# Patient Record
Sex: Male | Born: 1955 | Race: Black or African American | Hispanic: No | Marital: Married
Health system: Southern US, Community
[De-identification: ages and names within clinical notes are randomized; demographics above are authoritative.]

## PROBLEM LIST (undated history)

## (undated) DIAGNOSIS — U071 COVID-19: Secondary | ICD-10-CM

## (undated) DIAGNOSIS — J9621 Acute and chronic respiratory failure with hypoxia: Secondary | ICD-10-CM

## (undated) DIAGNOSIS — I4891 Unspecified atrial fibrillation: Secondary | ICD-10-CM

## (undated) DIAGNOSIS — J189 Pneumonia, unspecified organism: Secondary | ICD-10-CM

## (undated) DIAGNOSIS — J449 Chronic obstructive pulmonary disease, unspecified: Secondary | ICD-10-CM

---

## 1998-07-24 ENCOUNTER — Encounter: Admission: RE | Admit: 1998-07-24 | Discharge: 1998-10-22 | Payer: Self-pay | Admitting: Anesthesiology

## 1998-10-12 ENCOUNTER — Encounter: Payer: Self-pay | Admitting: Orthopedic Surgery

## 1998-10-17 ENCOUNTER — Observation Stay (HOSPITAL_COMMUNITY): Admission: RE | Admit: 1998-10-17 | Discharge: 1998-10-18 | Payer: Self-pay | Admitting: Orthopedic Surgery

## 1998-10-17 ENCOUNTER — Encounter: Payer: Self-pay | Admitting: Orthopedic Surgery

## 2020-06-14 ENCOUNTER — Inpatient Hospital Stay
Admission: EM | Admit: 2020-06-14 | Discharge: 2020-07-14 | Disposition: A | Discharge status: Skilled Nursing Facility | Payer: 59 | Source: Other Acute Inpatient Hospital | Attending: Internal Medicine | Admitting: Internal Medicine

## 2020-06-14 ENCOUNTER — Other Ambulatory Visit (HOSPITAL_COMMUNITY): Payer: 59

## 2020-06-14 DIAGNOSIS — J449 Chronic obstructive pulmonary disease, unspecified: Secondary | ICD-10-CM | POA: Diagnosis present

## 2020-06-14 DIAGNOSIS — I4891 Unspecified atrial fibrillation: Secondary | ICD-10-CM | POA: Diagnosis present

## 2020-06-14 DIAGNOSIS — J9621 Acute and chronic respiratory failure with hypoxia: Secondary | ICD-10-CM | POA: Diagnosis present

## 2020-06-14 DIAGNOSIS — J9690 Respiratory failure, unspecified, unspecified whether with hypoxia or hypercapnia: Secondary | ICD-10-CM

## 2020-06-14 DIAGNOSIS — J189 Pneumonia, unspecified organism: Secondary | ICD-10-CM | POA: Diagnosis present

## 2020-06-14 DIAGNOSIS — U071 COVID-19: Secondary | ICD-10-CM | POA: Diagnosis present

## 2020-06-14 DIAGNOSIS — Z931 Gastrostomy status: Secondary | ICD-10-CM

## 2020-06-14 HISTORY — DX: COVID-19: U07.1

## 2020-06-14 HISTORY — DX: Chronic obstructive pulmonary disease, unspecified: J44.9

## 2020-06-14 HISTORY — DX: Acute and chronic respiratory failure with hypoxia: J96.21

## 2020-06-14 HISTORY — DX: Pneumonia, unspecified organism: J18.9

## 2020-06-14 HISTORY — DX: Unspecified atrial fibrillation: I48.91

## 2020-06-14 LAB — BLOOD GAS, ARTERIAL
Acid-Base Excess: 9.4 mmol/L — ABNORMAL HIGH (ref 0.0–2.0)
Bicarbonate: 33.7 mmol/L — ABNORMAL HIGH (ref 20.0–28.0)
FIO2: 40
O2 Saturation: 90.7 %
Patient temperature: 37
pCO2 arterial: 49.4 mmHg — ABNORMAL HIGH (ref 32.0–48.0)
pH, Arterial: 7.449 (ref 7.350–7.450)
pO2, Arterial: 56.5 mmHg — ABNORMAL LOW (ref 83.0–108.0)

## 2020-06-14 MED ORDER — IOHEXOL 300 MG/ML  SOLN: 50.0000 mL | Freq: Once | INTRAMUSCULAR | Status: DC | PRN

## 2020-06-15 ENCOUNTER — Institutional Professional Consult (permissible substitution) (HOSPITAL_COMMUNITY): Payer: 59

## 2020-06-15 LAB — COMPREHENSIVE METABOLIC PANEL
ALT: 27 U/L (ref 0–44)
AST: 25 U/L (ref 15–41)
Albumin: 2.3 g/dL — ABNORMAL LOW (ref 3.5–5.0)
Alkaline Phosphatase: 86 U/L (ref 38–126)
Anion gap: 11 (ref 5–15)
BUN: 8 mg/dL (ref 8–23)
CO2: 30 mmol/L (ref 22–32)
Calcium: 9.1 mg/dL (ref 8.9–10.3)
Chloride: 95 mmol/L — ABNORMAL LOW (ref 98–111)
Creatinine, Ser: 0.51 mg/dL — ABNORMAL LOW (ref 0.61–1.24)
GFR calc Af Amer: >60 mL/min (ref >60)
GFR calc non Af Amer: >60 mL/min (ref >60)
Glucose, Bld: 125 mg/dL — ABNORMAL HIGH (ref 70–99)
Potassium: 4.1 mmol/L (ref 3.5–5.1)
Sodium: 136 mmol/L (ref 135–145)
Total Bilirubin: 0.6 mg/dL (ref 0.3–1.2)
Total Protein: 7.9 g/dL (ref 6.5–8.1)

## 2020-06-15 LAB — CBC
HCT: 30.5 % — ABNORMAL LOW (ref 39.0–52.0)
Hemoglobin: 9 g/dL — ABNORMAL LOW (ref 13.0–17.0)
MCH: 25.5 pg — ABNORMAL LOW (ref 26.0–34.0)
MCHC: 29.5 g/dL — ABNORMAL LOW (ref 30.0–36.0)
MCV: 86.4 fL (ref 80.0–100.0)
Platelets: 455 10*3/uL — ABNORMAL HIGH (ref 150–400)
RBC: 3.53 MIL/uL — ABNORMAL LOW (ref 4.22–5.81)
RDW: 19 % — ABNORMAL HIGH (ref 11.5–15.5)
WBC: 12.9 10*3/uL — ABNORMAL HIGH (ref 4.0–10.5)
nRBC: 0 % (ref 0.0–0.2)

## 2020-06-15 LAB — TSH: TSH: 1.327 u[IU]/mL (ref 0.350–4.500)

## 2020-06-15 LAB — DIGOXIN LEVEL: Digoxin Level: 0.4 ng/mL — ABNORMAL LOW (ref 0.8–2.0)

## 2020-06-17 ENCOUNTER — Encounter: Payer: Self-pay | Admitting: Internal Medicine

## 2020-06-17 DIAGNOSIS — J9621 Acute and chronic respiratory failure with hypoxia: Secondary | ICD-10-CM

## 2020-06-17 DIAGNOSIS — J189 Pneumonia, unspecified organism: Secondary | ICD-10-CM | POA: Diagnosis not present

## 2020-06-17 DIAGNOSIS — J449 Chronic obstructive pulmonary disease, unspecified: Secondary | ICD-10-CM

## 2020-06-17 DIAGNOSIS — U071 COVID-19: Secondary | ICD-10-CM

## 2020-06-17 DIAGNOSIS — I4891 Unspecified atrial fibrillation: Secondary | ICD-10-CM

## 2020-06-17 NOTE — Consult Note (Signed)
Pulmonary Critical Care Medicine [facility] [place]  PULMONARY SERVICE  Date of Service: 06/17/2020  PULMONARY CRITICAL CARE Vernon Schultz  DGL:[id number]  DOB: Oct 13, 1956   DOA: 06/14/2020  Referring Physician: Carron Curie, MD  HPI: Vernon Schultz is a 64 y.o. male seen for follow up of Acute on Chronic Respiratory Failure.  Patient has multiple medical problems including COPD PAD hypertension atrial fibrillation came into the hospital because of rapid atrial fibrillation.  Patient was plan for cardioversion however patient was found to be positive for COVID-19.  Subsequently patient converted to sinus rhythm and was discharged.  Came back to the hospital with increasing shortness of breath and there was some palpitations noted.  Some cough was noted saturations were down into the 70% range.  Patient at that time was evaluated and in obvious acute respiratory distress and ended up on BiPAP which the patient failed and then ended up intubated on the ventilator.  Subsequently was not able to come off of the ventilator so was transferred to our facility for further management and weaning.  Currently on assist control has been tolerating T collar weaning with a goal of about 8 hours today.  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  And of course unable to come off of the ventilator.  Review of Systems:  ROS performed and is unremarkable other than noted above.  Past medical history: Hypertension PAD COPD Atrial fibrillation Respiratory failure  Past surgical history: Tracheostomy  Social history: No drug abuse unknown about tobacco alcohol use  Family history: Noncontributory to the present illness  Medications: Reviewed on Rounds  Physical Exam:  Vitals: Temperature is 98.0 pulse 79 respiratory rate 30 blood pressure is 154/94 saturations 96%  Ventilator Settings mode ventilation assist  control FiO2 is 45% tidal volume is 440 PEEP 5  . General: Comfortable at this time . Eyes: Grossly normal lids, irises & conjunctiva . ENT: grossly tongue is normal . Neck: no obvious mass . Cardiovascular: S1-S2 normal no gallop or rub . Respiratory: No rhonchi coarse breath sounds . Abdomen: Soft and nontender . Skin: no rash seen on limited exam . Musculoskeletal: not rigid . Psychiatric:unable to assess . Neurologic: no seizure no involuntary movements         Labs on Admission:  Basic Metabolic Panel: Recent Labs  Lab 06/15/20 1413  NA 136  K 4.1  CL 95*  CO2 30  GLUCOSE 125*  BUN 8  CREATININE 0.51*  CALCIUM 9.1    Recent Labs  Lab 06/14/20 2125  PHART 7.449  PCO2ART 49.4*  PO2ART 56.5*  HCO3 33.7*  O2SAT 90.7    Liver Function Tests: Recent Labs  Lab 06/15/20 1413  AST 25  ALT 27  ALKPHOS 86  BILITOT 0.6  PROT 7.9  ALBUMIN 2.3*   No results for input(s): LIPASE, AMYLASE in the last 168 hours. No results for input(s): AMMONIA in the last 168 hours.  CBC: Recent Labs  Lab 06/15/20 1413  WBC 12.9*  HGB 9.0*  HCT 30.5*  MCV 86.4  PLT 455*    Cardiac Enzymes: No results for input(s): CKTOTAL, CKMB, CKMBINDEX, TROPONINI in the last 168 hours.  BNP (last 3 results) No results for input(s): BNP in the last 8760 hours.  ProBNP (last 3 results) No results for input(s): PROBNP in the last 8760 hours.   Radiological Exams on Admission: DG ABDOMEN PEG TUBE LOCATION  Result Date: 06/14/2020 CLINICAL DATA:  Peg tube placement EXAM: ABDOMEN - 1 VIEW COMPARISON:  None. FINDINGS: Gastrostomy tube projects over the stomach. Contrast material seen within the stomach and proximal small bowel. No evidence of contrast extravasation. Nonobstructive bowel gas pattern. IMPRESSION: Gastrostomy tube within the stomach.  No contrast extravasation. Electronically Signed   By: Rolm Baptise M.D.   On: 06/14/2020 21:42   DG Chest Port 1 View  Result Date:  06/15/2020 CLINICAL DATA:  Respiratory failure EXAM: PORTABLE CHEST 1 VIEW COMPARISON:  None. FINDINGS: Tracheostomy tube is present. Heart size is within normal limits. Patchy airspace opacities throughout the right lung and within the left lung base. No appreciable pleural fluid collection. No pneumothorax. IMPRESSION: Patchy airspace opacities throughout the right lung and within the left lung base. Findings are suspicious for multifocal pneumonia. Electronically Signed   By: Davina Poke D.O.   On: 06/15/2020 15:05    Assessment/Plan Active Problems:   Acute on chronic respiratory failure with hypoxia (HCC)   Rate controlled atrial fibrillation (HCC)   COPD, severe ([place])   COVID-19 virus infection   Healthcare-associated pneumonia   1. Acute on chronic respiratory failure with hypoxia patient currently is on full support on assist control mode.  Currently is on 45% FiO2 as mentioned above.  The goal today is for weaning on T collar for 8 hours.  We will continue to advance as tolerated. 2. Atrial fibrillation patient had converted to normal sinus rhythm has been on digoxin.  Be continued on current maintenance medications. 3. Severe COPD we will continue with supportive care nebulizers as needed we will continue to monitor 4. COVID-19 virus infection in recovery phase we will continue with supportive care 5. Healthcare associated pneumonia patient has received antibiotics for presumed healthcare pneumonia.  The patient has residual changes noted on the chest films  I have personally seen and evaluated the patient, evaluated laboratory and imaging results, formulated the assessment and plan and placed orders. The Patient requires high complexity decision making with multiple systems involvement.  Case was discussed on Rounds with the Respiratory Therapy Director and the Respiratory staff Time Spent 59minutes  Deundre Thong A Gerrianne Aydelott, MD [facility] Pulmonary Critical Care Medicine Sleep Medicine

## 2020-06-18 DIAGNOSIS — J9621 Acute and chronic respiratory failure with hypoxia: Secondary | ICD-10-CM | POA: Diagnosis not present

## 2020-06-18 DIAGNOSIS — U071 COVID-19: Secondary | ICD-10-CM | POA: Diagnosis not present

## 2020-06-18 DIAGNOSIS — J189 Pneumonia, unspecified organism: Secondary | ICD-10-CM | POA: Diagnosis not present

## 2020-06-18 DIAGNOSIS — J449 Chronic obstructive pulmonary disease, unspecified: Secondary | ICD-10-CM | POA: Diagnosis not present

## 2020-06-18 NOTE — Progress Notes (Signed)
Pulmonary Critical Care Medicine [facility] [place]   PULMONARY CRITICAL CARE SERVICE  PROGRESS NOTE  Date of Service: 06/18/2020  Vernon Schultz  UJW:[id number]  DOB: 20-Jan-1956   DOA: 06/14/2020  Referring Physician: Carron Curie, MD  HPI: Vernon Schultz is a 64 y.o. male seen for follow up of Acute on Chronic Respiratory Failure.  Patient is on T collar currently is on 50% FiO2 for goal of 12 hours today  Medications: Reviewed on Rounds  Physical Exam:  Vitals: Temperature is 97.7 pulse 69 respiratory 24 blood pressure is 162/86 saturations 98%  Ventilator Settings on T collar FiO2 50%  . General: Comfortable at this time . Eyes: Grossly normal lids, irises & conjunctiva . ENT: grossly tongue is normal . Neck: no obvious mass . Cardiovascular: S1 S2 normal no gallop . Respiratory: No rhonchi no rales are noted at this time . Abdomen: soft . Skin: no rash seen on limited exam . Musculoskeletal: not rigid . Psychiatric:unable to assess . Neurologic: no seizure no involuntary movements         Lab Data:   Basic Metabolic Panel: Recent Labs  Lab 06/15/20 1413  NA 136  K 4.1  CL 95*  CO2 30  GLUCOSE 125*  BUN 8  CREATININE 0.51*  CALCIUM 9.1    ABG: Recent Labs  Lab 06/14/20 2125  PHART 7.449  PCO2ART 49.4*  PO2ART 56.5*  HCO3 33.7*  O2SAT 90.7    Liver Function Tests: Recent Labs  Lab 06/15/20 1413  AST 25  ALT 27  ALKPHOS 86  BILITOT 0.6  PROT 7.9  ALBUMIN 2.3*   No results for input(s): LIPASE, AMYLASE in the last 168 hours. No results for input(s): AMMONIA in the last 168 hours.  CBC: Recent Labs  Lab 06/15/20 1413  WBC 12.9*  HGB 9.0*  HCT 30.5*  MCV 86.4  PLT 455*    Cardiac Enzymes: No results for input(s): CKTOTAL, CKMB, CKMBINDEX, TROPONINI in the last 168 hours.  BNP (last 3 results) No results for input(s): BNP in the last 8760 hours.  ProBNP (last 3 results) No results for input(s): PROBNP in the last 8760  hours.  Radiological Exams: No results found.  Assessment/Plan Active Problems:   Acute on chronic respiratory failure with hypoxia (HCC)   Rate controlled atrial fibrillation (HCC)   COPD, severe (HCC)   COVID-19 virus infection   Healthcare-associated pneumonia   1. Acute on chronic respiratory failure hypoxia continue T collar weaning as tolerated.  Goal today is 12 hours 2. Chronic atrial fibrillation rate controlled continue medical management 3. Severe COPD at baseline we will continue to follow 4. COVID-19 virus infection resolved 5. Healthcare associated pneumonia treated clinically is improved   I have personally seen and evaluated the patient, evaluated laboratory and imaging results, formulated the assessment and plan and placed orders. The Patient requires high complexity decision making with multiple systems involvement.  Rounds were done with the Respiratory Therapy Director and Staff therapists and discussed with nursing staff also.  Yevonne Pax, MD [facility] Pulmonary Critical Care Medicine Sleep Medicine

## 2020-06-19 ENCOUNTER — Other Ambulatory Visit (HOSPITAL_COMMUNITY): Payer: 59

## 2020-06-19 DIAGNOSIS — J9621 Acute and chronic respiratory failure with hypoxia: Secondary | ICD-10-CM | POA: Diagnosis not present

## 2020-06-19 DIAGNOSIS — U071 COVID-19: Secondary | ICD-10-CM | POA: Diagnosis not present

## 2020-06-19 DIAGNOSIS — J449 Chronic obstructive pulmonary disease, unspecified: Secondary | ICD-10-CM | POA: Diagnosis not present

## 2020-06-19 DIAGNOSIS — J189 Pneumonia, unspecified organism: Secondary | ICD-10-CM | POA: Diagnosis not present

## 2020-06-19 LAB — CBC
HCT: 28.3 % — ABNORMAL LOW (ref 39.0–52.0)
Hemoglobin: 8.4 g/dL — ABNORMAL LOW (ref 13.0–17.0)
MCH: 25.2 pg — ABNORMAL LOW (ref 26.0–34.0)
MCHC: 29.7 g/dL — ABNORMAL LOW (ref 30.0–36.0)
MCV: 85 fL (ref 80.0–100.0)
Platelets: 407 10*3/uL — ABNORMAL HIGH (ref 150–400)
RBC: 3.33 MIL/uL — ABNORMAL LOW (ref 4.22–5.81)
RDW: 18.5 % — ABNORMAL HIGH (ref 11.5–15.5)
WBC: 12.4 10*3/uL — ABNORMAL HIGH (ref 4.0–10.5)
nRBC: 0 % (ref 0.0–0.2)

## 2020-06-19 LAB — BASIC METABOLIC PANEL
Anion gap: 8 (ref 5–15)
BUN: 20 mg/dL (ref 8–23)
CO2: 32 mmol/L (ref 22–32)
Calcium: 8.9 mg/dL (ref 8.9–10.3)
Chloride: 97 mmol/L — ABNORMAL LOW (ref 98–111)
Creatinine, Ser: 0.78 mg/dL (ref 0.61–1.24)
GFR calc Af Amer: >60 mL/min (ref >60)
GFR calc non Af Amer: >60 mL/min (ref >60)
Glucose, Bld: 116 mg/dL — ABNORMAL HIGH (ref 70–99)
Potassium: 4.6 mmol/L (ref 3.5–5.1)
Sodium: 137 mmol/L (ref 135–145)

## 2020-06-19 NOTE — Progress Notes (Signed)
Pulmonary Critical Care Medicine [facility] [place]   PULMONARY CRITICAL CARE SERVICE  PROGRESS NOTE  Date of Service: 06/19/2020  Vernon Schultz  KNL:[id number]  DOB: 12/09/1956   DOA: 06/14/2020  Referring Physician: Carron Curie, MD  HPI: Vernon Schultz is a 64 y.o. male seen for follow up of Acute on Chronic Respiratory Failure.  Patient currently is on T collar has been on 50% FiO2  Medications: Reviewed on Rounds  Physical Exam:  Vitals: Temperature is 99.0 pulse 86 respiratory rate 29 blood pressure is 141/63 saturations 100%  Ventilator Settings on T collar FiO2 50%  . General: Comfortable at this time . Eyes: Grossly normal lids, irises & conjunctiva . ENT: grossly tongue is normal . Neck: no obvious mass . Cardiovascular: S1 S2 normal no gallop . Respiratory: No rhonchi coarse breath sounds . Abdomen: soft . Skin: no rash seen on limited exam . Musculoskeletal: not rigid . Psychiatric:unable to assess . Neurologic: no seizure no involuntary movements         Lab Data:   Basic Metabolic Panel: Recent Labs  Lab 06/15/20 1413 06/19/20 0703  NA 136 137  K 4.1 4.6  CL 95* 97*  CO2 30 32  GLUCOSE 125* 116*  BUN 8 20  CREATININE 0.51* 0.78  CALCIUM 9.1 8.9    ABG: Recent Labs  Lab 06/14/20 2125  PHART 7.449  PCO2ART 49.4*  PO2ART 56.5*  HCO3 33.7*  O2SAT 90.7    Liver Function Tests: Recent Labs  Lab 06/15/20 1413  AST 25  ALT 27  ALKPHOS 86  BILITOT 0.6  PROT 7.9  ALBUMIN 2.3*   No results for input(s): LIPASE, AMYLASE in the last 168 hours. No results for input(s): AMMONIA in the last 168 hours.  CBC: Recent Labs  Lab 06/15/20 1413 06/19/20 0703  WBC 12.9* 12.4*  HGB 9.0* 8.4*  HCT 30.5* 28.3*  MCV 86.4 85.0  PLT 455* 407*    Cardiac Enzymes: No results for input(s): CKTOTAL, CKMB, CKMBINDEX, TROPONINI in the last 168 hours.  BNP (last 3 results) No results for input(s): BNP in the last 8760 hours.  ProBNP  (last 3 results) No results for input(s): PROBNP in the last 8760 hours.  Radiological Exams: DG Chest Port 1 View  Result Date: 06/19/2020 CLINICAL DATA:  Respiratory failure.  COVID 19. EXAM: PORTABLE CHEST 1 VIEW COMPARISON:  One-view chest x-ray 6 06/19/2020 FINDINGS: Heart size is normal. Lung volumes are low. Tracheostomy tube is stable. Interstitial and airspace disease demonstrates slight improvement from the prior exam. IMPRESSION: Slight improvement in interstitial and airspace disease. Electronically Signed   By: Marin Roberts M.D.   On: 06/19/2020 06:26    Assessment/Plan Active Problems:   Acute on chronic respiratory failure with hypoxia (HCC)   Rate controlled atrial fibrillation (HCC)   COPD, severe (HCC)   COVID-19 virus infection   Healthcare-associated pneumonia   1. Acute on chronic respiratory failure with hypoxia we will continue with T collar currently on 50% FiO2 with a goal of 16 hours 2. Rate controlled atrial fibrillation continue with medical management 3. Severe COPD at baseline 4. COVID-19 virus infection supportive care and resolution phase 5. Healthcare associated pneumonia last chest x-ray showing some improvement in the interstitial disease   I have personally seen and evaluated the patient, evaluated laboratory and imaging results, formulated the assessment and plan and placed orders. The Patient requires high complexity decision making with multiple systems involvement.  Rounds were done with the Respiratory  Therapy Director and Staff therapists and discussed with nursing staff also.  Allyne Gee, MD [facility] Pulmonary Critical Care Medicine Sleep Medicine

## 2020-06-20 DIAGNOSIS — J449 Chronic obstructive pulmonary disease, unspecified: Secondary | ICD-10-CM | POA: Diagnosis not present

## 2020-06-20 DIAGNOSIS — J189 Pneumonia, unspecified organism: Secondary | ICD-10-CM | POA: Diagnosis not present

## 2020-06-20 DIAGNOSIS — U071 COVID-19: Secondary | ICD-10-CM | POA: Diagnosis not present

## 2020-06-20 DIAGNOSIS — J9621 Acute and chronic respiratory failure with hypoxia: Secondary | ICD-10-CM | POA: Diagnosis not present

## 2020-06-20 NOTE — Progress Notes (Signed)
Pulmonary Critical Care Medicine [facility] [place]   PULMONARY CRITICAL CARE SERVICE  PROGRESS NOTE  Date of Service: 06/20/2020  Vernon Schultz  SJG:[id number]  DOB: [birth year]   DOA: 06/14/2020  Referring Physician: Carron Curie, MD  HPI: Vernon Schultz is a 64 y.o. male seen for follow up of Acute on Chronic Respiratory Failure.  Patient is off the ventilator on T collar right now the goal is for 20 hours  Medications: Reviewed on Rounds  Physical Exam:  Vitals: Temperature is 97.9 pulse 93 respiratory 30 blood pressure is 153/87 saturations 97%  Ventilator Settings on T collar goal of 20 hours  . General: Comfortable at this time . Eyes: Grossly normal lids, irises & conjunctiva . ENT: grossly tongue is normal . Neck: no obvious mass . Cardiovascular: S1 S2 normal no gallop . Respiratory: No rhonchi no rales are noted at this time . Abdomen: soft . Skin: no rash seen on limited exam . Musculoskeletal: not rigid . Psychiatric:unable to assess . Neurologic: no seizure no involuntary movements         Lab Data:   Basic Metabolic Panel: Recent Labs  Lab 06/15/20 1413 06/19/20 0703  NA 136 137  K 4.1 4.6  CL 95* 97*  CO2 30 32  GLUCOSE 125* 116*  BUN 8 20  CREATININE 0.51* 0.78  CALCIUM 9.1 8.9    ABG: Recent Labs  Lab 06/14/20 2125  PHART 7.449  PCO2ART 49.4*  PO2ART 56.5*  HCO3 33.7*  O2SAT 90.7    Liver Function Tests: Recent Labs  Lab 06/15/20 1413  AST 25  ALT 27  ALKPHOS 86  BILITOT 0.6  PROT 7.9  ALBUMIN 2.3*   No results for input(s): LIPASE, AMYLASE in the last 168 hours. No results for input(s): AMMONIA in the last 168 hours.  CBC: Recent Labs  Lab 06/15/20 1413 06/19/20 0703  WBC 12.9* 12.4*  HGB 9.0* 8.4*  HCT 30.5* 28.3*  MCV 86.4 85.0  PLT 455* 407*    Cardiac Enzymes: No results for input(s): CKTOTAL, CKMB, CKMBINDEX, TROPONINI in the last 168 hours.  BNP (last 3 results) No results for input(s): BNP in  the last 8760 hours.  ProBNP (last 3 results) No results for input(s): PROBNP in the last 8760 hours.  Radiological Exams: DG Chest Port 1 View  Result Date: 06/19/2020 CLINICAL DATA:  Respiratory failure.  COVID 19. EXAM: PORTABLE CHEST 1 VIEW COMPARISON:  One-view chest x-ray 6 06/19/2020 FINDINGS: Heart size is normal. Lung volumes are low. Tracheostomy tube is stable. Interstitial and airspace disease demonstrates slight improvement from the prior exam. IMPRESSION: Slight improvement in interstitial and airspace disease. Electronically Signed   By: Marin Roberts M.D.   On: 06/19/2020 06:26    Assessment/Plan Active Problems:   Acute on chronic respiratory failure with hypoxia (HCC)   Rate controlled atrial fibrillation (HCC)   COPD, severe (HCC)   COVID-19 virus infection   Healthcare-associated pneumonia   1. Acute on chronic respiratory failure hypoxia we will continue with T collar trials goal 20 hours 2. Atrial fibrillation rate is controlled we will continue to follow 3. Severe COPD at baseline continue present management 4. COVID-19 virus infection x-ray showing slight improvement 5. Healthcare associated pneumonia again x-ray showing slight improvement we will continue to follow   I have personally seen and evaluated the patient, evaluated laboratory and imaging results, formulated the assessment and plan and placed orders. The Patient requires high complexity decision making with multiple systems involvement.  Rounds were done with the Respiratory Therapy Director and Staff therapists and discussed with nursing staff also.  Allyne Gee, MD [facility] Pulmonary Critical Care Medicine Sleep Medicine

## 2020-06-21 DIAGNOSIS — J9621 Acute and chronic respiratory failure with hypoxia: Secondary | ICD-10-CM | POA: Diagnosis not present

## 2020-06-21 DIAGNOSIS — J449 Chronic obstructive pulmonary disease, unspecified: Secondary | ICD-10-CM | POA: Diagnosis not present

## 2020-06-21 DIAGNOSIS — U071 COVID-19: Secondary | ICD-10-CM | POA: Diagnosis not present

## 2020-06-21 DIAGNOSIS — J189 Pneumonia, unspecified organism: Secondary | ICD-10-CM | POA: Diagnosis not present

## 2020-06-21 NOTE — Progress Notes (Signed)
Pulmonary Critical Care Medicine [facility] [place]   PULMONARY CRITICAL CARE SERVICE  PROGRESS NOTE  Date of Service: 06/21/2020  Vernon Schultz  KDT:[id number]  DOB: [birth year]   DOA: 06/14/2020  Referring Physician: Carron Curie, MD  HPI: Vernon Schultz is a 64 y.o. male seen for follow up of Acute on Chronic Respiratory Failure.  Patient currently is on T collar has been on 40% FiO2 with good saturations.  Medications: Reviewed on Rounds  Physical Exam:  Vitals: Temperature is 97.8 pulse 83 respiratory 26 blood pressure is 174/86 saturations 96%  Ventilator Settings on T collar with an FiO2 of 40%  . General: Comfortable at this time . Eyes: Grossly normal lids, irises & conjunctiva . ENT: grossly tongue is normal . Neck: no obvious mass . Cardiovascular: S1 S2 normal no gallop . Respiratory: No rhonchi no rales are noted at this time . Abdomen: soft . Skin: no rash seen on limited exam . Musculoskeletal: not rigid . Psychiatric:unable to assess . Neurologic: no seizure no involuntary movements         Lab Data:   Basic Metabolic Panel: Recent Labs  Lab 06/15/20 1413 06/19/20 0703  NA 136 137  K 4.1 4.6  CL 95* 97*  CO2 30 32  GLUCOSE 125* 116*  BUN 8 20  CREATININE 0.51* 0.78  CALCIUM 9.1 8.9    ABG: Recent Labs  Lab 06/14/20 2125  PHART 7.449  PCO2ART 49.4*  PO2ART 56.5*  HCO3 33.7*  O2SAT 90.7    Liver Function Tests: Recent Labs  Lab 06/15/20 1413  AST 25  ALT 27  ALKPHOS 86  BILITOT 0.6  PROT 7.9  ALBUMIN 2.3*   No results for input(s): LIPASE, AMYLASE in the last 168 hours. No results for input(s): AMMONIA in the last 168 hours.  CBC: Recent Labs  Lab 06/15/20 1413 06/19/20 0703  WBC 12.9* 12.4*  HGB 9.0* 8.4*  HCT 30.5* 28.3*  MCV 86.4 85.0  PLT 455* 407*    Cardiac Enzymes: No results for input(s): CKTOTAL, CKMB, CKMBINDEX, TROPONINI in the last 168 hours.  BNP (last 3 results) No results for input(s): BNP  in the last 8760 hours.  ProBNP (last 3 results) No results for input(s): PROBNP in the last 8760 hours.  Radiological Exams: No results found.  Assessment/Plan Active Problems:   Acute on chronic respiratory failure with hypoxia (HCC)   Rate controlled atrial fibrillation (HCC)   COPD, severe (HCC)   COVID-19 virus infection   Healthcare-associated pneumonia   1. Acute on chronic respiratory failure hypoxia we will continue with T collar trials patient is on 40% FiO2 continue secretion management supportive care. 2. Atrial fibrillation rate is controlled we will continue to follow 3. Severe COPD at baseline continue present management 4. COVID-19 virus infection treated in resolution phase 5. Healthcare associated pneumonia improving   I have personally seen and evaluated the patient, evaluated laboratory and imaging results, formulated the assessment and plan and placed orders. The Patient requires high complexity decision making with multiple systems involvement.  Rounds were done with the Respiratory Therapy Director and Staff therapists and discussed with nursing staff also.  Vernon Pax, MD [facility] Pulmonary Critical Care Medicine Sleep Medicine

## 2020-06-22 DIAGNOSIS — J9621 Acute and chronic respiratory failure with hypoxia: Secondary | ICD-10-CM | POA: Diagnosis not present

## 2020-06-22 DIAGNOSIS — J189 Pneumonia, unspecified organism: Secondary | ICD-10-CM | POA: Diagnosis not present

## 2020-06-22 DIAGNOSIS — J449 Chronic obstructive pulmonary disease, unspecified: Secondary | ICD-10-CM | POA: Diagnosis not present

## 2020-06-22 DIAGNOSIS — U071 COVID-19: Secondary | ICD-10-CM | POA: Diagnosis not present

## 2020-06-22 NOTE — Progress Notes (Addendum)
Pulmonary Critical Care Medicine [facility] [place]   PULMONARY CRITICAL CARE SERVICE  PROGRESS NOTE  Date of Service: 06/22/2020  Vernon Schultz  ZOX:[id number]  DOB: March 27, 1956   DOA: 06/14/2020  Referring Physician: Carron Curie, MD  HPI: Vernon Schultz is a 64 y.o. male seen for follow up of Acute on Chronic Respiratory Failure. Patient was unable to wean to aerosol trach collar today placed back on pressure support at this time currently on 50% FiO2 satting well.  Medications: Reviewed on Rounds  Physical Exam:  Vitals: Pulse 80 respirations 28 BP 154/78 O2 sat 99% temp 98.0  Ventilator Settings pressure support over 5 FiO2 50%  . General: Comfortable at this time . Eyes: Grossly normal lids, irises & conjunctiva . ENT: grossly tongue is normal . Neck: no obvious mass . Cardiovascular: S1 S2 normal no gallop . Respiratory: No rales or rhonchi noted . Abdomen: soft . Skin: no rash seen on limited exam . Musculoskeletal: not rigid . Psychiatric:unable to assess . Neurologic: no seizure no involuntary movements         Lab Data:   Basic Metabolic Panel: Recent Labs  Lab 06/19/20 0703  NA 137  K 4.6  CL 97*  CO2 32  GLUCOSE 116*  BUN 20  CREATININE 0.78  CALCIUM 8.9    ABG: No results for input(s): PHART, PCO2ART, PO2ART, HCO3, O2SAT in the last 168 hours.  Liver Function Tests: No results for input(s): AST, ALT, ALKPHOS, BILITOT, PROT, ALBUMIN in the last 168 hours. No results for input(s): LIPASE, AMYLASE in the last 168 hours. No results for input(s): AMMONIA in the last 168 hours.  CBC: Recent Labs  Lab 06/19/20 0703  WBC 12.4*  HGB 8.4*  HCT 28.3*  MCV 85.0  PLT 407*    Cardiac Enzymes: No results for input(s): CKTOTAL, CKMB, CKMBINDEX, TROPONINI in the last 168 hours.  BNP (last 3 results) No results for input(s): BNP in the last 8760 hours.  ProBNP (last 3 results) No results for input(s): PROBNP in the last 8760  hours.  Radiological Exams: No results found.  Assessment/Plan Active Problems:   Acute on chronic respiratory failure with hypoxia (HCC)   Rate controlled atrial fibrillation (HCC)   COPD, severe (HCC)   COVID-19 virus infection   Healthcare-associated pneumonia   1. Acute on chronic respiratory failure hypoxia patient will remain on pressure support at this time we will continue to attempt weaning aerosol trach collar as patient tolerate. Currently on 40% FiO2 satting well continue supportive measures and pulmonary toilet. 2. Atrial fibrillation rate is controlled we will continue to follow 3. Severe COPD at baseline continue present management 4. COVID-19 virus infection treated in resolution phase 5. Healthcare associated pneumonia improving   I have personally seen and evaluated the patient, evaluated laboratory and imaging results, formulated the assessment and plan and placed orders. The Patient requires high complexity decision making with multiple systems involvement.  Rounds were done with the Respiratory Therapy Director and Staff therapists and discussed with nursing staff also.  Yevonne Pax, MD [facility] Pulmonary Critical Care Medicine Sleep Medicine

## 2020-06-23 DIAGNOSIS — J9621 Acute and chronic respiratory failure with hypoxia: Secondary | ICD-10-CM | POA: Diagnosis not present

## 2020-06-23 DIAGNOSIS — U071 COVID-19: Secondary | ICD-10-CM | POA: Diagnosis not present

## 2020-06-23 DIAGNOSIS — J189 Pneumonia, unspecified organism: Secondary | ICD-10-CM | POA: Diagnosis not present

## 2020-06-23 DIAGNOSIS — J449 Chronic obstructive pulmonary disease, unspecified: Secondary | ICD-10-CM | POA: Diagnosis not present

## 2020-06-23 NOTE — Progress Notes (Addendum)
Pulmonary Critical Care Medicine [facility] [place]   PULMONARY CRITICAL CARE SERVICE  PROGRESS NOTE  Date of Service: 06/23/2020  Vernon Schultz  OJJ:[id number]  DOB: 1956/11/19   DOA: 06/14/2020  Referring Physician: Carron Curie, MD  HPI: Vernon Schultz is a 64 y.o. male seen for follow up of Acute on Chronic Respiratory Failure. Patient remains on pressure support at this time currently on 40% FiO2 satting well.   Medications: Reviewed on Rounds  Physical Exam:  Vitals: Pulse 73 respirations 28 BP 140/78 O2 sat 99% temp 96.9  Ventilator Settings per support 12/5 FiO2 40%  . General: Comfortable at this time . Eyes: Grossly normal lids, irises & conjunctiva . ENT: grossly tongue is normal . Neck: no obvious mass . Cardiovascular: S1 S2 normal no gallop 1. Respiratory: No rales or rhonchi noted . Healthcare associated pneumonia improving . Abdomen: soft . Skin: no rash seen on limited exam . Musculoskeletal: not rigid . Psychiatric:unable to assess . Neurologic: no seizure no involuntary movements         Lab Data:   Basic Metabolic Panel: Recent Labs  Lab 06/19/20 0703  NA 137  K 4.6  CL 97*  CO2 32  GLUCOSE 116*  BUN 20  CREATININE 0.78  CALCIUM 8.9    ABG: No results for input(s): PHART, PCO2ART, PO2ART, HCO3, O2SAT in the last 168 hours.  Liver Function Tests: No results for input(s): AST, ALT, ALKPHOS, BILITOT, PROT, ALBUMIN in the last 168 hours. No results for input(s): LIPASE, AMYLASE in the last 168 hours. No results for input(s): AMMONIA in the last 168 hours.  CBC: Recent Labs  Lab 06/19/20 0703  WBC 12.4*  HGB 8.4*  HCT 28.3*  MCV 85.0  PLT 407*    Cardiac Enzymes: No results for input(s): CKTOTAL, CKMB, CKMBINDEX, TROPONINI in the last 168 hours.  BNP (last 3 results) No results for input(s): BNP in the last 8760 hours.  ProBNP (last 3 results) No results for input(s): PROBNP in the last 8760 hours.  Radiological  Exams: No results found.  Assessment/Plan Active Problems:   Acute on chronic respiratory failure with hypoxia (HCC)   Rate controlled atrial fibrillation (HCC)   COPD, severe (HCC)   COVID-19 virus infection   Healthcare-associated pneumonia   1.Acute on chronic respiratory failure hypoxia we will continue with T collar trials patient is on 40% FiO2 continue secretion management supportive care. 2. Atrial fibrillation rate is controlled we will continue to follow 3. Severe COPD at baseline continue present management 4. COVID-19 virus infection treated in resolution phase 5. Healthcare associated pneumonia improving   I have personally seen and evaluated the patient, evaluated laboratory and imaging results, formulated the assessment and plan and placed orders. The Patient requires high complexity decision making with multiple systems involvement.  Rounds were done with the Respiratory Therapy Director and Staff therapists and discussed with nursing staff also.  Yevonne Pax, MD [facility] Pulmonary Critical Care Medicine Sleep Medicine

## 2020-06-24 DIAGNOSIS — J189 Pneumonia, unspecified organism: Secondary | ICD-10-CM | POA: Diagnosis not present

## 2020-06-24 DIAGNOSIS — U071 COVID-19: Secondary | ICD-10-CM | POA: Diagnosis not present

## 2020-06-24 DIAGNOSIS — J9621 Acute and chronic respiratory failure with hypoxia: Secondary | ICD-10-CM | POA: Diagnosis not present

## 2020-06-24 DIAGNOSIS — J449 Chronic obstructive pulmonary disease, unspecified: Secondary | ICD-10-CM | POA: Diagnosis not present

## 2020-06-24 LAB — CBC
HCT: 28.9 % — ABNORMAL LOW (ref 39.0–52.0)
Hemoglobin: 8.5 g/dL — ABNORMAL LOW (ref 13.0–17.0)
MCH: 24.9 pg — ABNORMAL LOW (ref 26.0–34.0)
MCHC: 29.4 g/dL — ABNORMAL LOW (ref 30.0–36.0)
MCV: 84.8 fL (ref 80.0–100.0)
Platelets: 332 10*3/uL (ref 150–400)
RBC: 3.41 MIL/uL — ABNORMAL LOW (ref 4.22–5.81)
RDW: 19.1 % — ABNORMAL HIGH (ref 11.5–15.5)
WBC: 8.4 10*3/uL (ref 4.0–10.5)
nRBC: 0 % (ref 0.0–0.2)

## 2020-06-24 LAB — COMPREHENSIVE METABOLIC PANEL
ALT: 19 U/L (ref 0–44)
AST: 18 U/L (ref 15–41)
Albumin: 2.4 g/dL — ABNORMAL LOW (ref 3.5–5.0)
Alkaline Phosphatase: 89 U/L (ref 38–126)
Anion gap: 11 (ref 5–15)
BUN: 14 mg/dL (ref 8–23)
CO2: 30 mmol/L (ref 22–32)
Calcium: 8.8 mg/dL — ABNORMAL LOW (ref 8.9–10.3)
Chloride: 95 mmol/L — ABNORMAL LOW (ref 98–111)
Creatinine, Ser: 0.54 mg/dL — ABNORMAL LOW (ref 0.61–1.24)
GFR calc Af Amer: >60 mL/min (ref >60)
GFR calc non Af Amer: >60 mL/min (ref >60)
Glucose, Bld: 124 mg/dL — ABNORMAL HIGH (ref 70–99)
Potassium: 4.2 mmol/L (ref 3.5–5.1)
Sodium: 136 mmol/L (ref 135–145)
Total Bilirubin: 0.6 mg/dL (ref 0.3–1.2)
Total Protein: 7.8 g/dL (ref 6.5–8.1)

## 2020-06-24 NOTE — Progress Notes (Addendum)
Pulmonary Critical Care Medicine [facility] [place]   PULMONARY CRITICAL CARE SERVICE  PROGRESS NOTE  Date of Service: 06/24/2020  Vernon Schultz  HWE:[id number]  DOB: 1956-02-07   DOA: 06/14/2020  Referring Physician: Carron Curie, MD  HPI: Vernon Schultz is a 64 y.o. male seen for follow up of Acute on Chronic Respiratory Failure. Patient has weaned down to aerosol trach collar 50% FiO2 day for goal of 12 hours. Currently satting well no distress.  Medications: Reviewed on Rounds  Physical Exam:  Vitals: Pulse 71 respirations 24 BP 144/81 O2 sat 100% temp 97.1  Ventilator Settings ATC 50%  . General: Comfortable at this time . Eyes: Grossly normal lids, irises & conjunctiva . ENT: grossly tongue is normal . Neck: no obvious mass . Cardiovascular: S1 S2 normal no gallop . Respiratory: No rales or rhonchi noted . Abdomen: soft . Skin: no rash seen on limited exam . Musculoskeletal: not rigid . Psychiatric:unable to assess . Neurologic: no seizure no involuntary movements         Lab Data:   Basic Metabolic Panel: Recent Labs  Lab 06/19/20 0703  NA 137  K 4.6  CL 97*  CO2 32  GLUCOSE 116*  BUN 20  CREATININE 0.78  CALCIUM 8.9    ABG: No results for input(s): PHART, PCO2ART, PO2ART, HCO3, O2SAT in the last 168 hours.  Liver Function Tests: No results for input(s): AST, ALT, ALKPHOS, BILITOT, PROT, ALBUMIN in the last 168 hours. No results for input(s): LIPASE, AMYLASE in the last 168 hours. No results for input(s): AMMONIA in the last 168 hours.  CBC: Recent Labs  Lab 06/19/20 0703  WBC 12.4*  HGB 8.4*  HCT 28.3*  MCV 85.0  PLT 407*    Cardiac Enzymes: No results for input(s): CKTOTAL, CKMB, CKMBINDEX, TROPONINI in the last 168 hours.  BNP (last 3 results) No results for input(s): BNP in the last 8760 hours.  ProBNP (last 3 results) No results for input(s): PROBNP in the last 8760 hours.  Radiological Exams: No results  found.  Assessment/Plan Active Problems:   Acute on chronic respiratory failure with hypoxia (HCC)   Rate controlled atrial fibrillation (HCC)   COPD, severe (HCC)   COVID-19 virus infection   Healthcare-associated pneumonia   1. Acute on chronic respiratory failure hypoxia patient will continue to attempt weaning. Currently on ATC for a 12-hour goal at 50% FiO2. Continue aggressive pulmonary toilet supportive measures. 2. Atrial fibrillation rate is controlled we will continue to follow 3. Severe COPD at baseline continue present management 4. COVID-19 virus infection treated in resolution phase 5. Healthcare associated pneumonia improving   I have personally seen and evaluated the patient, evaluated laboratory and imaging results, formulated the assessment and plan and placed orders. The Patient requires high complexity decision making with multiple systems involvement.  Rounds were done with the Respiratory Therapy Director and Staff therapists and discussed with nursing staff also.  Yevonne Pax, MD [facility] Pulmonary Critical Care Medicine Sleep Medicine

## 2020-06-25 DIAGNOSIS — J9621 Acute and chronic respiratory failure with hypoxia: Secondary | ICD-10-CM | POA: Diagnosis not present

## 2020-06-25 DIAGNOSIS — J189 Pneumonia, unspecified organism: Secondary | ICD-10-CM | POA: Diagnosis not present

## 2020-06-25 DIAGNOSIS — U071 COVID-19: Secondary | ICD-10-CM | POA: Diagnosis not present

## 2020-06-25 DIAGNOSIS — J449 Chronic obstructive pulmonary disease, unspecified: Secondary | ICD-10-CM | POA: Diagnosis not present

## 2020-06-25 NOTE — Progress Notes (Addendum)
Pulmonary Critical Care Medicine [facility] [place]   PULMONARY CRITICAL CARE SERVICE  PROGRESS NOTE  Date of Service: 06/25/2020  Velma Hanna  PPI:[id number]  DOB: May 24, 1956   DOA: 06/14/2020  Referring Physician: Carron Curie, MD  HPI: Vernon Schultz is a 64 y.o. male seen for follow up of Acute on Chronic Respiratory Failure.  Patient remains on aerosol trach collar 40% FiO2 has completed 24 hours and is now going for 48.  Medications: Reviewed on Rounds  Physical Exam:  Vitals: Pulse 61 respirations 27 BP 143/75 O2 sat 100% temp 97.0  Ventilator Settings 40% ATC   General: Comfortable at this time  Eyes: Grossly normal lids, irises & conjunctiva  ENT: grossly tongue is normal  Neck: no obvious mass  Cardiovascular: S1 S2 normal no gallop  Respiratory: No rales or rhonchi noted  Abdomen: soft  Skin: no rash seen on limited exam  Musculoskeletal: not rigid  Psychiatric:unable to assess  Neurologic: no seizure no involuntary movements         Lab Data:   Basic Metabolic Panel: Recent Labs  Lab 06/19/20 0703 06/24/20 1105  NA 137 136  K 4.6 4.2  CL 97* 95*  CO2 32 30  GLUCOSE 116* 124*  BUN 20 14  CREATININE 0.78 0.54*  CALCIUM 8.9 8.8*    ABG: No results for input(s): PHART, PCO2ART, PO2ART, HCO3, O2SAT in the last 168 hours.  Liver Function Tests: Recent Labs  Lab 06/24/20 1105  AST 18  ALT 19  ALKPHOS 89  BILITOT 0.6  PROT 7.8  ALBUMIN 2.4*   No results for input(s): LIPASE, AMYLASE in the last 168 hours. No results for input(s): AMMONIA in the last 168 hours.  CBC: Recent Labs  Lab 06/19/20 0703 06/24/20 1105  WBC 12.4* 8.4  HGB 8.4* 8.5*  HCT 28.3* 28.9*  MCV 85.0 84.8  PLT 407* 332    Cardiac Enzymes: No results for input(s): CKTOTAL, CKMB, CKMBINDEX, TROPONINI in the last 168 hours.  BNP (last 3 results) No results for input(s): BNP in the last 8760 hours.  ProBNP (last 3 results) No results for  input(s): PROBNP in the last 8760 hours.  Radiological Exams: No results found.  Assessment/Plan Active Problems:   Acute on chronic respiratory failure with hypoxia (HCC)   Rate controlled atrial fibrillation (HCC)   COPD, severe (HCC)   COVID-19 virus infection   Healthcare-associated pneumonia   1. Acute on chronic respiratory failure hypoxia  patient continue on 40% aerosol trach collar for goal of 48 hours at this time continue aggressive pulmonary toilet supportive measures. 2. Atrial fibrillation rate is controlled we will continue to follow 3. Severe COPD at baseline continue present management 4. COVID-19 virus infection treated in resolution phase 5. Healthcare associated pneumonia improving   I have personally seen and evaluated the patient, evaluated laboratory and imaging results, formulated the assessment and plan and placed orders. The Patient requires high complexity decision making with multiple systems involvement.  Rounds were done with the Respiratory Therapy Director and Staff therapists and discussed with nursing staff also.  Yevonne Pax, MD [facility] Pulmonary Critical Care Medicine Sleep Medicine

## 2020-06-26 DIAGNOSIS — U071 COVID-19: Secondary | ICD-10-CM | POA: Diagnosis not present

## 2020-06-26 DIAGNOSIS — J189 Pneumonia, unspecified organism: Secondary | ICD-10-CM | POA: Diagnosis not present

## 2020-06-26 DIAGNOSIS — J9621 Acute and chronic respiratory failure with hypoxia: Secondary | ICD-10-CM | POA: Diagnosis not present

## 2020-06-26 DIAGNOSIS — J449 Chronic obstructive pulmonary disease, unspecified: Secondary | ICD-10-CM | POA: Diagnosis not present

## 2020-06-26 NOTE — Progress Notes (Addendum)
Pulmonary Critical Care Medicine [facility] [place]   PULMONARY CRITICAL CARE SERVICE  PROGRESS NOTE  Date of Service: 06/26/2020  [name]  QHU:[id number]  DOB: Apr 06, 1956   DOA: 06/14/2020  Referring Physician: Carron Curie, MD  HPI: Vernon Schultz is a 64 y.o. male seen for follow up of Acute on Chronic Respiratory Failure.  Patient is a 48-hour goal today on aerosol trach collar 35% currently satting well no distress.  Medications: Reviewed on Rounds  Physical Exam:  Vitals: Pulse 60 respirations 25 BP 154/82 O2 sat 100% temp 97.8  Ventilator Settings ATC 35%  . General: Comfortable at this time . Eyes: Grossly normal lids, irises & conjunctiva . ENT: grossly tongue is normal . Neck: no obvious mass . Cardiovascular: S1 S2 normal no gallop . Respiratory: No rales or rhonchi noted . Abdomen: soft . Skin: no rash seen on limited exam . Musculoskeletal: not rigid . Psychiatric:unable to assess . Neurologic: no seizure no involuntary movements         Lab Data:   Basic Metabolic Panel: Recent Labs  Lab 06/24/20 1105  NA 136  K 4.2  CL 95*  CO2 30  GLUCOSE 124*  BUN 14  CREATININE 0.54*  CALCIUM 8.8*    ABG: No results for input(s): PHART, PCO2ART, PO2ART, HCO3, O2SAT in the last 168 hours.  Liver Function Tests: Recent Labs  Lab 06/24/20 1105  AST 18  ALT 19  ALKPHOS 89  BILITOT 0.6  PROT 7.8  ALBUMIN 2.4*   No results for input(s): LIPASE, AMYLASE in the last 168 hours. No results for input(s): AMMONIA in the last 168 hours.  CBC: Recent Labs  Lab 06/24/20 1105  WBC 8.4  HGB 8.5*  HCT 28.9*  MCV 84.8  PLT 332    Cardiac Enzymes: No results for input(s): CKTOTAL, CKMB, CKMBINDEX, TROPONINI in the last 168 hours.  BNP (last 3 results) No results for input(s): BNP in the last 8760 hours.  ProBNP (last 3 results) No results for input(s): PROBNP in the last 8760 hours.  Radiological Exams: No results  found.  Assessment/Plan Active Problems:   Acute on chronic respiratory failure with hypoxia (HCC)   Rate controlled atrial fibrillation (HCC)   COPD, severe (HCC)   COVID-19 virus infection   Healthcare-associated pneumonia   1. Acute on chronic respiratory failure hypoxiapatient will continue with 35% aerosol trach collar for 48-hour goal at this time continue supportive measures. 2. Atrial fibrillation rate is controlled we will continue to follow 3. Severe COPD at baseline continue present management 4. COVID-19 virus infection treated in resolution phase 5. Healthcare associated pneumonia improving   I have personally seen and evaluated the patient, evaluated laboratory and imaging results, formulated the assessment and plan and placed orders. The Patient requires high complexity decision making with multiple systems involvement.  Rounds were done with the Respiratory Therapy Director and Staff therapists and discussed with nursing staff also.  Yevonne Pax, MD [facility] Pulmonary Critical Care Medicine Sleep Medicine

## 2020-06-27 DIAGNOSIS — J189 Pneumonia, unspecified organism: Secondary | ICD-10-CM | POA: Diagnosis not present

## 2020-06-27 DIAGNOSIS — J449 Chronic obstructive pulmonary disease, unspecified: Secondary | ICD-10-CM | POA: Diagnosis not present

## 2020-06-27 DIAGNOSIS — J9621 Acute and chronic respiratory failure with hypoxia: Secondary | ICD-10-CM | POA: Diagnosis not present

## 2020-06-27 DIAGNOSIS — U071 COVID-19: Secondary | ICD-10-CM | POA: Diagnosis not present

## 2020-06-27 NOTE — Progress Notes (Addendum)
Pulmonary Critical Care Medicine [facility] [place]   PULMONARY CRITICAL CARE SERVICE  PROGRESS NOTE  Date of Service: 06/27/2020  Vernon Schultz  [id number]:[id number]  DOB: 1956/03/30   DOA: 06/14/2020  Referring Physician: Carron Curie, MD  HPI: Vernon Schultz is a 64 y.o. male seen for follow up of Acute on Chronic Respiratory Failure.  Patient is now been 48 hours on 35% aerosol trach collar satting well.  Medications: Reviewed on Rounds  Physical Exam:  Vitals: Pulse 72 respirations 24 BP 147/82 O2 sat 100% temp 97.6  Ventilator Settings ATC 35%  . General: Comfortable at this time . Eyes: Grossly normal lids, irises & conjunctiva . ENT: grossly tongue is normal . Neck: no obvious mass . Cardiovascular: S1 S2 normal no gallop . Respiratory: No rales or rhonchi noted . Abdomen: soft . Skin: no rash seen on limited exam . Musculoskeletal: not rigid . Psychiatric:unable to assess . Neurologic: no seizure no involuntary movements         Lab Data:   Basic Metabolic Panel: Recent Labs  Lab 06/24/20 1105  NA 136  K 4.2  CL 95*  CO2 30  GLUCOSE 124*  BUN 14  CREATININE 0.54*  CALCIUM 8.8*    ABG: No results for input(s): PHART, PCO2ART, PO2ART, HCO3, O2SAT in the last 168 hours.  Liver Function Tests: Recent Labs  Lab 06/24/20 1105  AST 18  ALT 19  ALKPHOS 89  BILITOT 0.6  PROT 7.8  ALBUMIN 2.4*   No results for input(s): LIPASE, AMYLASE in the last 168 hours. No results for input(s): AMMONIA in the last 168 hours.  CBC: Recent Labs  Lab 06/24/20 1105  WBC 8.4  HGB 8.5*  HCT 28.9*  MCV 84.8  PLT 332    Cardiac Enzymes: No results for input(s): CKTOTAL, CKMB, CKMBINDEX, TROPONINI in the last 168 hours.  BNP (last 3 results) No results for input(s): BNP in the last 8760 hours.  ProBNP (last 3 results) No results for input(s): PROBNP in the last 8760 hours.  Radiological Exams: No results found.  Assessment/Plan Active  Problems:   Acute on chronic respiratory failure with hypoxia (HCC)   Rate controlled atrial fibrillation (HCC)   COPD, severe (HCC)   COVID-19 virus infection   Healthcare-associated pneumonia   1. Acute on chronic respiratory failure hypoxiapatient continue on aerosol trach collar at this time 35% FiO2.  Continue supportive measures and pulmonary toilet 2. Atrial fibrillation rate is controlled we will continue to follow 3. Severe COPD at baseline continue present management 4. COVID-19 virus infection treated in resolution phase 5. Healthcare associated pneumonia improving   I have personally seen and evaluated the patient, evaluated laboratory and imaging results, formulated the assessment and plan and placed orders. The Patient requires high complexity decision making with multiple systems involvement.  Rounds were done with the Respiratory Therapy Director and Staff therapists and discussed with nursing staff also.  Yevonne Pax, MD [facility] Pulmonary Critical Care Medicine Sleep Medicine

## 2020-06-28 DIAGNOSIS — J449 Chronic obstructive pulmonary disease, unspecified: Secondary | ICD-10-CM | POA: Diagnosis not present

## 2020-06-28 DIAGNOSIS — U071 COVID-19: Secondary | ICD-10-CM | POA: Diagnosis not present

## 2020-06-28 DIAGNOSIS — J189 Pneumonia, unspecified organism: Secondary | ICD-10-CM | POA: Diagnosis not present

## 2020-06-28 DIAGNOSIS — J9621 Acute and chronic respiratory failure with hypoxia: Secondary | ICD-10-CM | POA: Diagnosis not present

## 2020-06-28 LAB — CBC
HCT: 29.6 % — ABNORMAL LOW (ref 39.0–52.0)
Hemoglobin: 8.6 g/dL — ABNORMAL LOW (ref 13.0–17.0)
MCH: 24.9 pg — ABNORMAL LOW (ref 26.0–34.0)
MCHC: 29.1 g/dL — ABNORMAL LOW (ref 30.0–36.0)
MCV: 85.5 fL (ref 80.0–100.0)
Platelets: 349 10*3/uL (ref 150–400)
RBC: 3.46 MIL/uL — ABNORMAL LOW (ref 4.22–5.81)
RDW: 19.9 % — ABNORMAL HIGH (ref 11.5–15.5)
WBC: 7.6 10*3/uL (ref 4.0–10.5)
nRBC: 0 % (ref 0.0–0.2)

## 2020-06-28 LAB — BASIC METABOLIC PANEL
Anion gap: 10 (ref 5–15)
BUN: 11 mg/dL (ref 8–23)
CO2: 30 mmol/L (ref 22–32)
Calcium: 8.9 mg/dL (ref 8.9–10.3)
Chloride: 98 mmol/L (ref 98–111)
Creatinine, Ser: 0.57 mg/dL — ABNORMAL LOW (ref 0.61–1.24)
GFR calc Af Amer: >60 mL/min (ref >60)
GFR calc non Af Amer: >60 mL/min (ref >60)
Glucose, Bld: 115 mg/dL — ABNORMAL HIGH (ref 70–99)
Potassium: 4.2 mmol/L (ref 3.5–5.1)
Sodium: 138 mmol/L (ref 135–145)

## 2020-06-28 LAB — MAGNESIUM: Magnesium: 1.8 mg/dL (ref 1.7–2.4)

## 2020-06-28 NOTE — Progress Notes (Addendum)
Pulmonary Critical Care Medicine [facility] [place]   PULMONARY CRITICAL CARE SERVICE  PROGRESS NOTE  Date of Service: 06/28/2020  Vernon Schultz  TIR:[id number]  DOB: [birth year]   DOA: 06/14/2020  Referring Physician: Carron Curie, MD  HPI: Vernon Schultz is a 64 y.o. male seen for follow up of Acute on Chronic Respiratory Failure.  Patient's trach was changed to a #6 cuffless yesterday is currently on 35% aerosol trach collar satting well does have some anxiety.  Medications: Reviewed on Rounds  Physical Exam:  Vitals: Pulse 68 respirations 24 BP 128/72 O2 sat 100% temp 97 8  Ventilator Settings ATC 35%  . General: Comfortable at this time . Eyes: Grossly normal lids, irises & conjunctiva . ENT: grossly tongue is normal . Neck: no obvious mass . Cardiovascular: S1 S2 normal no gallop . Respiratory: No rales or rhonchi noted . Abdomen: soft . Skin: no rash seen on limited exam . Musculoskeletal: not rigid . Psychiatric:unable to assess . Neurologic: no seizure no involuntary movements         Lab Data:   Basic Metabolic Panel: Recent Labs  Lab 06/24/20 1105 06/28/20 0637  NA 136 138  K 4.2 4.2  CL 95* 98  CO2 30 30  GLUCOSE 124* 115*  BUN 14 11  CREATININE 0.54* 0.57*  CALCIUM 8.8* 8.9  MG  --  1.8    ABG: No results for input(s): PHART, PCO2ART, PO2ART, HCO3, O2SAT in the last 168 hours.  Liver Function Tests: Recent Labs  Lab 06/24/20 1105  AST 18  ALT 19  ALKPHOS 89  BILITOT 0.6  PROT 7.8  ALBUMIN 2.4*   No results for input(s): LIPASE, AMYLASE in the last 168 hours. No results for input(s): AMMONIA in the last 168 hours.  CBC: Recent Labs  Lab 06/24/20 1105 06/28/20 0637  WBC 8.4 7.6  HGB 8.5* 8.6*  HCT 28.9* 29.6*  MCV 84.8 85.5  PLT 332 349    Cardiac Enzymes: No results for input(s): CKTOTAL, CKMB, CKMBINDEX, TROPONINI in the last 168 hours.  BNP (last 3 results) No results for input(s): BNP in the last 8760  hours.  ProBNP (last 3 results) No results for input(s): PROBNP in the last 8760 hours.  Radiological Exams: No results found.  Assessment/Plan Active Problems:   Acute on chronic respiratory failure with hypoxia (HCC)   Rate controlled atrial fibrillation (HCC)   COPD, severe (HCC)   COVID-19 virus infection   Healthcare-associated pneumonia   1. Acute on chronic respiratory failure hypoxiapatient will continue with aerosol trach collar continue aggressive pulmonary toilet supportive measures. 2. Atrial fibrillation rate is controlled we will continue to follow 3. Severe COPD at baseline continue present management 4. COVID-19 virus infection treated in resolution phase 5. Healthcare associated pneumonia improving   I have personally seen and evaluated the patient, evaluated laboratory and imaging results, formulated the assessment and plan and placed orders. The Patient requires high complexity decision making with multiple systems involvement.  Rounds were done with the Respiratory Therapy Director and Staff therapists and discussed with nursing staff also.  Yevonne Pax, MD [facility] Pulmonary Critical Care Medicine Sleep Medicine

## 2020-06-29 DIAGNOSIS — J449 Chronic obstructive pulmonary disease, unspecified: Secondary | ICD-10-CM | POA: Diagnosis not present

## 2020-06-29 DIAGNOSIS — U071 COVID-19: Secondary | ICD-10-CM | POA: Diagnosis not present

## 2020-06-29 DIAGNOSIS — J189 Pneumonia, unspecified organism: Secondary | ICD-10-CM | POA: Diagnosis not present

## 2020-06-29 DIAGNOSIS — J9621 Acute and chronic respiratory failure with hypoxia: Secondary | ICD-10-CM | POA: Diagnosis not present

## 2020-06-29 NOTE — Progress Notes (Addendum)
Pulmonary Critical Care Medicine [facility] [place]   PULMONARY CRITICAL CARE SERVICE  PROGRESS NOTE  Date of Service: 06/29/2020  Vernon Schultz  [id number]  DOB: [birth year]   DOA: 06/14/2020  Referring Physician: Carron Curie, MD  HPI: Vernon Schultz is a 64 y.o. male seen for follow up of Acute on Chronic Respiratory Failure.  Patient is currently on 40% aerosol trach collar satting well no fever or distress.  Medications: Reviewed on Rounds  Physical Exam:  Vitals: Pulse 61 respirations 20 BP 150/75 O2 sat 100% temp 97 point  Ventilator Settings 40% ATC  . General: Comfortable at this time . Eyes: Grossly normal lids, irises & conjunctiva . ENT: grossly tongue is normal . Neck: no obvious mass . Cardiovascular: S1 S2 normal no gallop . Respiratory: No rales or rhonchi noted . Abdomen: soft . Skin: no rash seen on limited exam . Musculoskeletal: not rigid . Psychiatric:unable to assess . Neurologic: no seizure no involuntary movements         Lab Data:   Basic Metabolic Panel: Recent Labs  Lab 06/24/20 1105 06/28/20 0637  NA 136 138  K 4.2 4.2  CL 95* 98  CO2 30 30  GLUCOSE 124* 115*  BUN 14 11  CREATININE 0.54* 0.57*  CALCIUM 8.8* 8.9  MG  --  1.8    ABG: No results for input(s): PHART, PCO2ART, PO2ART, HCO3, O2SAT in the last 168 hours.  Liver Function Tests: Recent Labs  Lab 06/24/20 1105  AST 18  ALT 19  ALKPHOS 89  BILITOT 0.6  PROT 7.8  ALBUMIN 2.4*   No results for input(s): LIPASE, AMYLASE in the last 168 hours. No results for input(s): AMMONIA in the last 168 hours.  CBC: Recent Labs  Lab 06/24/20 1105 06/28/20 0637  WBC 8.4 7.6  HGB 8.5* 8.6*  HCT 28.9* 29.6*  MCV 84.8 85.5  PLT 332 349    Cardiac Enzymes: No results for input(s): CKTOTAL, CKMB, CKMBINDEX, TROPONINI in the last 168 hours.  BNP (last 3 results) No results for input(s): BNP in the last 8760 hours.  ProBNP (last 3 results) No results for  input(s): PROBNP in the last 8760 hours.  Radiological Exams: No results found.  Assessment/Plan Active Problems:   Acute on chronic respiratory failure with hypoxia (HCC)   Rate controlled atrial fibrillation (HCC)   COPD, severe (HCC)   COVID-19 virus infection   Healthcare-associated pneumonia   1. Acute on chronic respiratory failure hypoxiapatient continue 35% aerosol trach collar we will continue to attempt weaning as tolerated.  Continue aggressive pulmonary toilet supportive measures. 2. Atrial fibrillation rate is controlled we will continue to follow 3. Severe COPD at baseline continue present management 4. COVID-19 virus infection treated in resolution phase 5. Healthcare associated pneumonia improving   I have personally seen and evaluated the patient, evaluated laboratory and imaging results, formulated the assessment and plan and placed orders. The Patient requires high complexity decision making with multiple systems involvement.  Rounds were done with the Respiratory Therapy Director and Staff therapists and discussed with nursing staff also.  Yevonne Pax, MD [facility] Pulmonary Critical Care Medicine Sleep Medicine

## 2020-06-30 DIAGNOSIS — J449 Chronic obstructive pulmonary disease, unspecified: Secondary | ICD-10-CM | POA: Diagnosis not present

## 2020-06-30 DIAGNOSIS — J9621 Acute and chronic respiratory failure with hypoxia: Secondary | ICD-10-CM | POA: Diagnosis not present

## 2020-06-30 DIAGNOSIS — U071 COVID-19: Secondary | ICD-10-CM | POA: Diagnosis not present

## 2020-06-30 DIAGNOSIS — J189 Pneumonia, unspecified organism: Secondary | ICD-10-CM | POA: Diagnosis not present

## 2020-06-30 NOTE — Progress Notes (Addendum)
Pulmonary Critical Care Medicine [facility] [place]   PULMONARY CRITICAL CARE SERVICE  PROGRESS NOTE  Date of Service: 06/30/2020  Vernon Schultz  [id number]  DOB: 02-22-1956   DOA: 06/14/2020  Referring Physician: Carron Curie, MD  HPI: Vernon Schultz is a 64 y.o. male seen for follow up of Acute on Chronic Respiratory Failure.  Patient remains on 40% aerosol trach collar no fever or distress noted.  Medications: Reviewed on Rounds  Physical Exam:  Vitals: Pulse 74 respirations 28 BP 142/95 O2 sat 100% temp 97.7  Ventilator Settings ATC 40%  . General: Comfortable at this time . Eyes: Grossly normal lids, irises & conjunctiva . ENT: grossly tongue is normal . Neck: no obvious mass . Cardiovascular: S1 S2 normal no gallop . Respiratory: No rales or rhonchi noted . Abdomen: soft . Skin: no rash seen on limited exam . Musculoskeletal: not rigid . Psychiatric:unable to assess . Neurologic: no seizure no involuntary movements         Lab Data:   Basic Metabolic Panel: Recent Labs  Lab 06/24/20 1105 06/28/20 0637  NA 136 138  K 4.2 4.2  CL 95* 98  CO2 30 30  GLUCOSE 124* 115*  BUN 14 11  CREATININE 0.54* 0.57*  CALCIUM 8.8* 8.9  MG  --  1.8    ABG: No results for input(s): PHART, PCO2ART, PO2ART, HCO3, O2SAT in the last 168 hours.  Liver Function Tests: Recent Labs  Lab 06/24/20 1105  AST 18  ALT 19  ALKPHOS 89  BILITOT 0.6  PROT 7.8  ALBUMIN 2.4*   No results for input(s): LIPASE, AMYLASE in the last 168 hours. No results for input(s): AMMONIA in the last 168 hours.  CBC: Recent Labs  Lab 06/24/20 1105 06/28/20 0637  WBC 8.4 7.6  HGB 8.5* 8.6*  HCT 28.9* 29.6*  MCV 84.8 85.5  PLT 332 349    Cardiac Enzymes: No results for input(s): CKTOTAL, CKMB, CKMBINDEX, TROPONINI in the last 168 hours.  BNP (last 3 results) No results for input(s): BNP in the last 8760 hours.  ProBNP (last 3 results) No results for input(s): PROBNP in  the last 8760 hours.  Radiological Exams: No results found.  Assessment/Plan Active Problems:   Acute on chronic respiratory failure with hypoxia (HCC)   Rate controlled atrial fibrillation (HCC)   COPD, severe (HCC)   COVID-19 virus infection   Healthcare-associated pneumonia   1. Acute on chronic respiratory failure hypoxia patient will continue on aerosol trach collar at this time continue weaning FiO2 as tolerated.  Continue current pulmonary toilet supportive measures. 2. Atrial fibrillation rate is controlled we will continue to follow 3. Severe COPD at baseline continue present management 4. COVID-19 virus infection treated in resolution phase 5. Healthcare associated pneumonia improving   I have personally seen and evaluated the patient, evaluated laboratory and imaging results, formulated the assessment and plan and placed orders. The Patient requires high complexity decision making with multiple systems involvement.  Rounds were done with the Respiratory Therapy Director and Staff therapists and discussed with nursing staff also.  Yevonne Pax, MD [facility] Pulmonary Critical Care Medicine Sleep Medicine

## 2020-07-01 DIAGNOSIS — J449 Chronic obstructive pulmonary disease, unspecified: Secondary | ICD-10-CM | POA: Diagnosis not present

## 2020-07-01 DIAGNOSIS — U071 COVID-19: Secondary | ICD-10-CM | POA: Diagnosis not present

## 2020-07-01 DIAGNOSIS — J9621 Acute and chronic respiratory failure with hypoxia: Secondary | ICD-10-CM | POA: Diagnosis not present

## 2020-07-01 DIAGNOSIS — J189 Pneumonia, unspecified organism: Secondary | ICD-10-CM | POA: Diagnosis not present

## 2020-07-01 LAB — DIGOXIN LEVEL: Digoxin Level: 0.6 ng/mL — ABNORMAL LOW (ref 0.8–2.0)

## 2020-07-01 NOTE — Progress Notes (Signed)
Pulmonary Critical Care Medicine [facility] [place]   PULMONARY CRITICAL CARE SERVICE  PROGRESS NOTE  Date of Service: 07/01/2020  Schultz Schultz  IZT:[id number]  DOB: [birth year]   DOA: 06/14/2020  Referring Physician: Carron Curie, MD  HPI: Schultz Schultz is a 64 y.o. male seen for follow up of Acute on Chronic Respiratory Failure.  Patient at this time is on T collar has been requiring 40% oxygen.  Good saturations are noted.  Secretions are fair to moderate.  Medications: Reviewed on Rounds  Physical Exam:  Vitals: Temperature is 95.5 pulse 83 respiratory rate 38 blood pressure is 151/85 saturations 99%  Ventilator Settings off the ventilator on T collar with an FiO2 of 40%   General: Comfortable at this time  Eyes: Grossly normal lids, irises & conjunctiva  ENT: grossly tongue is normal  Neck: no obvious mass  Cardiovascular: S1 S2 normal no gallop  Respiratory: No rhonchi coarse breath sounds  Abdomen: soft  Skin: no rash seen on limited exam  Musculoskeletal: not rigid  Psychiatric:unable to assess  Neurologic: no seizure no involuntary movements         Lab Data:   Basic Metabolic Panel: Recent Labs  Lab 06/28/20 0637  NA 138  K 4.2  CL 98  CO2 30  GLUCOSE 115*  BUN 11  CREATININE 0.57*  CALCIUM 8.9  MG 1.8    ABG: No results for input(s): PHART, PCO2ART, PO2ART, HCO3, O2SAT in the last 168 hours.  Liver Function Tests: No results for input(s): AST, ALT, ALKPHOS, BILITOT, PROT, ALBUMIN in the last 168 hours. No results for input(s): LIPASE, AMYLASE in the last 168 hours. No results for input(s): AMMONIA in the last 168 hours.  CBC: Recent Labs  Lab 06/28/20 0637  WBC 7.6  HGB 8.6*  HCT 29.6*  MCV 85.5  PLT 349    Cardiac Enzymes: No results for input(s): CKTOTAL, CKMB, CKMBINDEX, TROPONINI in the last 168 hours.  BNP (last 3 results) No results for input(s): BNP in the last 8760 hours.  ProBNP (last 3 results) No  results for input(s): PROBNP in the last 8760 hours.  Radiological Exams: No results found.  Assessment/Plan Active Problems:   Acute on chronic respiratory failure with hypoxia (HCC)   Rate controlled atrial fibrillation (HCC)   COPD, severe (HCC)   COVID-19 virus infection   Healthcare-associated pneumonia   1. Acute on chronic respiratory failure hypoxia plan is to continue with T collar patient is requiring 40% FiO2. 2. Atrial fibrillation currently rate controlled we will continue to monitor 3. Severe COPD at baseline continue with supportive care 4. COVID-19 virus infection treated resolved 5. Healthcare associated pneumonia clinically is improved   I have personally seen and evaluated the patient, evaluated laboratory and imaging results, formulated the assessment and plan and placed orders. The Patient requires high complexity decision making with multiple systems involvement.  Rounds were done with the Respiratory Therapy Director and Staff therapists and discussed with nursing staff also.  Yevonne Pax, MD [facility] Pulmonary Critical Care Medicine Sleep Medicine

## 2020-07-02 DIAGNOSIS — J189 Pneumonia, unspecified organism: Secondary | ICD-10-CM | POA: Diagnosis not present

## 2020-07-02 DIAGNOSIS — J449 Chronic obstructive pulmonary disease, unspecified: Secondary | ICD-10-CM | POA: Diagnosis not present

## 2020-07-02 DIAGNOSIS — J9621 Acute and chronic respiratory failure with hypoxia: Secondary | ICD-10-CM | POA: Diagnosis not present

## 2020-07-02 DIAGNOSIS — U071 COVID-19: Secondary | ICD-10-CM | POA: Diagnosis not present

## 2020-07-02 NOTE — Progress Notes (Signed)
Pulmonary Critical Care Medicine [facility] [place]   PULMONARY CRITICAL CARE SERVICE  PROGRESS NOTE  Date of Service: 07/02/2020  Vernon Schultz  [id number]  DOB: [birth year]   DOA: 06/14/2020  Referring Physician: Carron Curie, MD  HPI: Vernon Schultz is a 64 y.o. male seen for follow up of Acute on Chronic Respiratory Failure.  Patient currently is on T collar has been on 40% FiO2 with good saturations.  Secretions are fair to moderate still  Medications: Reviewed on Rounds  Physical Exam:  Vitals: Temperature is 97.0 pulse 80 respiratory rate 30 blood pressure is 171/88 saturations 100%  Ventilator Settings on T collar FiO2 is 40%   General: Comfortable at this time  Eyes: Grossly normal lids, irises & conjunctiva  ENT: grossly tongue is normal  Neck: no obvious mass  Cardiovascular: S1 S2 normal no gallop  Respiratory: No rhonchi coarse breath sounds  Abdomen: soft  Skin: no rash seen on limited exam  Musculoskeletal: not rigid  Psychiatric:unable to assess  Neurologic: no seizure no involuntary movements         Lab Data:   Basic Metabolic Panel: Recent Labs  Lab 06/28/20 0637  NA 138  K 4.2  CL 98  CO2 30  GLUCOSE 115*  BUN 11  CREATININE 0.57*  CALCIUM 8.9  MG 1.8    ABG: No results for input(s): PHART, PCO2ART, PO2ART, HCO3, O2SAT in the last 168 hours.  Liver Function Tests: No results for input(s): AST, ALT, ALKPHOS, BILITOT, PROT, ALBUMIN in the last 168 hours. No results for input(s): LIPASE, AMYLASE in the last 168 hours. No results for input(s): AMMONIA in the last 168 hours.  CBC: Recent Labs  Lab 06/28/20 0637  WBC 7.6  HGB 8.6*  HCT 29.6*  MCV 85.5  PLT 349    Cardiac Enzymes: No results for input(s): CKTOTAL, CKMB, CKMBINDEX, TROPONINI in the last 168 hours.  BNP (last 3 results) No results for input(s): BNP in the last 8760 hours.  ProBNP (last 3 results) No results for input(s): PROBNP in the last  8760 hours.  Radiological Exams: No results found.  Assessment/Plan Active Problems:   Acute on chronic respiratory failure with hypoxia (HCC)   Rate controlled atrial fibrillation (HCC)   COPD, severe (HCC)   COVID-19 virus infection   Healthcare-associated pneumonia   1. Acute on chronic respiratory failure hypoxia we will continue with T collar trials patient currently on 40% FiO2 continue with secretion management supportive care 2. Atrial fibrillation rate is controlled at this time we will continue with following 3. Severe COPD at baseline we will monitor 4. COVID-19 virus infection 5. Continue with supportive care 6. Healthcare associated pneumonia treated   I have personally seen and evaluated the patient, evaluated laboratory and imaging results, formulated the assessment and plan and placed orders. The Patient requires high complexity decision making with multiple systems involvement.  Rounds were done with the Respiratory Therapy Director and Staff therapists and discussed with nursing staff also.  Yevonne Pax, MD [facility] Pulmonary Critical Care Medicine Sleep Medicine

## 2020-07-03 DIAGNOSIS — J189 Pneumonia, unspecified organism: Secondary | ICD-10-CM | POA: Diagnosis not present

## 2020-07-03 DIAGNOSIS — J9621 Acute and chronic respiratory failure with hypoxia: Secondary | ICD-10-CM | POA: Diagnosis not present

## 2020-07-03 DIAGNOSIS — U071 COVID-19: Secondary | ICD-10-CM | POA: Diagnosis not present

## 2020-07-03 DIAGNOSIS — J449 Chronic obstructive pulmonary disease, unspecified: Secondary | ICD-10-CM | POA: Diagnosis not present

## 2020-07-03 NOTE — Progress Notes (Signed)
Pulmonary Critical Care Medicine [facility] [place]   PULMONARY CRITICAL CARE SERVICE  PROGRESS NOTE  Date of Service: 07/03/2020  Vernon Schultz  [id number]  DOB: [birth year]   DOA: 06/14/2020  Referring Physician: Carron Curie, MD  HPI: Vernon Schultz is a 64 y.o. male seen for follow up of Acute on Chronic Respiratory Failure.  Patient remains on T collar on 40% FiO2 with the PMV  Medications: Reviewed on Rounds  Physical Exam:  Vitals: Temperature is 97.9 pulse 79 respiratory rate 38 blood pressure is 125/93 saturations 99%  Ventilator Settings on T collar FiO2 40% with the PMV  . General: Comfortable at this time . Eyes: Grossly normal lids, irises & conjunctiva . ENT: grossly tongue is normal . Neck: no obvious mass . Cardiovascular: S1 S2 normal no gallop . Respiratory: No rhonchi no rales are noted at this time . Abdomen: soft . Skin: no rash seen on limited exam . Musculoskeletal: not rigid . Psychiatric:unable to assess . Neurologic: no seizure no involuntary movements         Lab Data:   Basic Metabolic Panel: Recent Labs  Lab 06/28/20 0637  NA 138  K 4.2  CL 98  CO2 30  GLUCOSE 115*  BUN 11  CREATININE 0.57*  CALCIUM 8.9  MG 1.8    ABG: No results for input(s): PHART, PCO2ART, PO2ART, HCO3, O2SAT in the last 168 hours.  Liver Function Tests: No results for input(s): AST, ALT, ALKPHOS, BILITOT, PROT, ALBUMIN in the last 168 hours. No results for input(s): LIPASE, AMYLASE in the last 168 hours. No results for input(s): AMMONIA in the last 168 hours.  CBC: Recent Labs  Lab 06/28/20 0637  WBC 7.6  HGB 8.6*  HCT 29.6*  MCV 85.5  PLT 349    Cardiac Enzymes: No results for input(s): CKTOTAL, CKMB, CKMBINDEX, TROPONINI in the last 168 hours.  BNP (last 3 results) No results for input(s): BNP in the last 8760 hours.  ProBNP (last 3 results) No results for input(s): PROBNP in the last 8760 hours.  Radiological Exams: No  results found.  Assessment/Plan Active Problems:   Acute on chronic respiratory failure with hypoxia (HCC)   Rate controlled atrial fibrillation (HCC)   COPD, severe (HCC)   COVID-19 virus infection   Healthcare-associated pneumonia   1. Acute on chronic respiratory failure hypoxia on T collar FiO2 40% using PMV 2. Atrial fibrillation rate is controlled we will continue with supportive care 3. Severe COPD at baseline 4. COVID-19 virus infection treated 5. Healthcare associated pneumonia clinically is improving   I have personally seen and evaluated the patient, evaluated laboratory and imaging results, formulated the assessment and plan and placed orders. The Patient requires high complexity decision making with multiple systems involvement.  Rounds were done with the Respiratory Therapy Director and Staff therapists and discussed with nursing staff also.  Yevonne Pax, MD [facility] Pulmonary Critical Care Medicine Sleep Medicine

## 2020-07-04 DIAGNOSIS — U071 COVID-19: Secondary | ICD-10-CM | POA: Diagnosis not present

## 2020-07-04 DIAGNOSIS — J449 Chronic obstructive pulmonary disease, unspecified: Secondary | ICD-10-CM | POA: Diagnosis not present

## 2020-07-04 DIAGNOSIS — J189 Pneumonia, unspecified organism: Secondary | ICD-10-CM | POA: Diagnosis not present

## 2020-07-04 DIAGNOSIS — J9621 Acute and chronic respiratory failure with hypoxia: Secondary | ICD-10-CM | POA: Diagnosis not present

## 2020-07-04 LAB — CBC
HCT: 33.3 % — ABNORMAL LOW (ref 39.0–52.0)
Hemoglobin: 10.1 g/dL — ABNORMAL LOW (ref 13.0–17.0)
MCH: 26.1 pg (ref 26.0–34.0)
MCHC: 30.3 g/dL (ref 30.0–36.0)
MCV: 86 fL (ref 80.0–100.0)
Platelets: 432 10*3/uL — ABNORMAL HIGH (ref 150–400)
RBC: 3.87 MIL/uL — ABNORMAL LOW (ref 4.22–5.81)
RDW: 21 % — ABNORMAL HIGH (ref 11.5–15.5)
WBC: 11.6 10*3/uL — ABNORMAL HIGH (ref 4.0–10.5)
nRBC: 0.6 % — ABNORMAL HIGH (ref 0.0–0.2)

## 2020-07-04 LAB — BASIC METABOLIC PANEL
Anion gap: 12 (ref 5–15)
BUN: 20 mg/dL (ref 8–23)
CO2: 26 mmol/L (ref 22–32)
Calcium: 9 mg/dL (ref 8.9–10.3)
Chloride: 98 mmol/L (ref 98–111)
Creatinine, Ser: 0.71 mg/dL (ref 0.61–1.24)
GFR calc Af Amer: >60 mL/min (ref >60)
GFR calc non Af Amer: >60 mL/min (ref >60)
Glucose, Bld: 118 mg/dL — ABNORMAL HIGH (ref 70–99)
Potassium: 5.7 mmol/L — ABNORMAL HIGH (ref 3.5–5.1)
Sodium: 136 mmol/L (ref 135–145)

## 2020-07-04 NOTE — Progress Notes (Signed)
Pulmonary Critical Care Medicine [facility] [place]   PULMONARY CRITICAL CARE SERVICE  PROGRESS NOTE  Date of Service: 07/04/2020  Vernon Schultz  XNA:[id number]  DOB: 1956/03/17   DOA: 06/14/2020  Referring Physician: Carron Curie, MD  HPI: Vernon Schultz is a 64 y.o. male seen for follow up of Acute on Chronic Respiratory Failure.  Patient currently is on T collar has been on 35% FiO2 with PMV.  Today we will try to do capping trials  Medications: Reviewed on Rounds  Physical Exam:  Vitals: Temperature is 97.3 pulse 97 respiratory 27 blood pressure is 145/84 saturations 97%  Ventilator Settings on T collar with an FiO2 of 35% with PMV in place  . General: Comfortable at this time . Eyes: Grossly normal lids, irises & conjunctiva . ENT: grossly tongue is normal . Neck: no obvious mass . Cardiovascular: S1 S2 normal no gallop . Respiratory: No rhonchi coarse breath sounds . Abdomen: soft . Skin: no rash seen on limited exam . Musculoskeletal: not rigid . Psychiatric:unable to assess . Neurologic: no seizure no involuntary movements         Lab Data:   Basic Metabolic Panel: Recent Labs  Lab 06/28/20 0637 07/04/20 0530  NA 138 136  K 4.2 5.7*  CL 98 98  CO2 30 26  GLUCOSE 115* 118*  BUN 11 20  CREATININE 0.57* 0.71  CALCIUM 8.9 9.0  MG 1.8  --     ABG: No results for input(s): PHART, PCO2ART, PO2ART, HCO3, O2SAT in the last 168 hours.  Liver Function Tests: No results for input(s): AST, ALT, ALKPHOS, BILITOT, PROT, ALBUMIN in the last 168 hours. No results for input(s): LIPASE, AMYLASE in the last 168 hours. No results for input(s): AMMONIA in the last 168 hours.  CBC: Recent Labs  Lab 06/28/20 0637 07/04/20 0530  WBC 7.6 11.6*  HGB 8.6* 10.1*  HCT 29.6* 33.3*  MCV 85.5 86.0  PLT 349 432*    Cardiac Enzymes: No results for input(s): CKTOTAL, CKMB, CKMBINDEX, TROPONINI in the last 168 hours.  BNP (last 3 results) No results for  input(s): BNP in the last 8760 hours.  ProBNP (last 3 results) No results for input(s): PROBNP in the last 8760 hours.  Radiological Exams: No results found.  Assessment/Plan Active Problems:   Acute on chronic respiratory failure with hypoxia (HCC)   Rate controlled atrial fibrillation (HCC)   COPD, severe (HCC)   COVID-19 virus infection   Healthcare-associated pneumonia   1. Acute on chronic respiratory failure with hypoxia we will continue to try to advance the weaning trials with capping trials today. 2. Severe COPD at baseline we will continue with present management nebulizers as needed 3. Healthcare associated pneumonia treated clinically improving 4. COVID-19 virus infection treated we will continue with supportive care now is in resolution phase 5. Atrial fibrillation rate controlled we will continue to monitor   I have personally seen and evaluated the patient, evaluated laboratory and imaging results, formulated the assessment and plan and placed orders. The Patient requires high complexity decision making with multiple systems involvement.  Rounds were done with the Respiratory Therapy Director and Staff therapists and discussed with nursing staff also.  Yevonne Pax, MD [facility] Pulmonary Critical Care Medicine Sleep Medicine

## 2020-07-05 DIAGNOSIS — U071 COVID-19: Secondary | ICD-10-CM | POA: Diagnosis not present

## 2020-07-05 DIAGNOSIS — J9621 Acute and chronic respiratory failure with hypoxia: Secondary | ICD-10-CM | POA: Diagnosis not present

## 2020-07-05 DIAGNOSIS — J449 Chronic obstructive pulmonary disease, unspecified: Secondary | ICD-10-CM | POA: Diagnosis not present

## 2020-07-05 DIAGNOSIS — J189 Pneumonia, unspecified organism: Secondary | ICD-10-CM | POA: Diagnosis not present

## 2020-07-05 LAB — CBC
HCT: 35.8 % — ABNORMAL LOW (ref 39.0–52.0)
Hemoglobin: 10.4 g/dL — ABNORMAL LOW (ref 13.0–17.0)
MCH: 25.3 pg — ABNORMAL LOW (ref 26.0–34.0)
MCHC: 29.1 g/dL — ABNORMAL LOW (ref 30.0–36.0)
MCV: 87.1 fL (ref 80.0–100.0)
Platelets: 391 10*3/uL (ref 150–400)
RBC: 4.11 MIL/uL — ABNORMAL LOW (ref 4.22–5.81)
RDW: 21 % — ABNORMAL HIGH (ref 11.5–15.5)
WBC: 7.8 10*3/uL (ref 4.0–10.5)
nRBC: 0 % (ref 0.0–0.2)

## 2020-07-05 LAB — POTASSIUM: Potassium: 4.5 mmol/L (ref 3.5–5.1)

## 2020-07-05 NOTE — Progress Notes (Signed)
Pulmonary Critical Care Medicine [facility] [place]   PULMONARY CRITICAL CARE SERVICE  PROGRESS NOTE  Date of Service: 07/05/2020  Tyee Vandevoorde  [id number]  DOB: Jun 11, 1956   DOA: 06/14/2020  Referring Physician: Carron Curie, MD  HPI: Barron Vanloan is a 64 y.o. male seen for follow up of Acute on Chronic Respiratory Failure.  Patient is capping on 3 L oxygen today will be completing 24 hours of capping  Medications: Reviewed on Rounds  Physical Exam:  Vitals: Temperature is 97.6 pulse 92 respiratory 22 blood pressure is 158/90 saturations 100%  Ventilator Settings capping on 3 L O2   General: Comfortable at this time  Eyes: Grossly normal lids, irises & conjunctiva  ENT: grossly tongue is normal  Neck: no obvious mass  Cardiovascular: S1 S2 normal no gallop  Respiratory: No rhonchi coarse breath sounds  Abdomen: soft  Skin: no rash seen on limited exam  Musculoskeletal: not rigid  Psychiatric:unable to assess  Neurologic: no seizure no involuntary movements         Lab Data:   Basic Metabolic Panel: Recent Labs  Lab 07/04/20 0530  NA 136  K 5.7*  CL 98  CO2 26  GLUCOSE 118*  BUN 20  CREATININE 0.71  CALCIUM 9.0    ABG: No results for input(s): PHART, PCO2ART, PO2ART, HCO3, O2SAT in the last 168 hours.  Liver Function Tests: No results for input(s): AST, ALT, ALKPHOS, BILITOT, PROT, ALBUMIN in the last 168 hours. No results for input(s): LIPASE, AMYLASE in the last 168 hours. No results for input(s): AMMONIA in the last 168 hours.  CBC: Recent Labs  Lab 07/04/20 0530  WBC 11.6*  HGB 10.1*  HCT 33.3*  MCV 86.0  PLT 432*    Cardiac Enzymes: No results for input(s): CKTOTAL, CKMB, CKMBINDEX, TROPONINI in the last 168 hours.  BNP (last 3 results) No results for input(s): BNP in the last 8760 hours.  ProBNP (last 3 results) No results for input(s): PROBNP in the last 8760 hours.  Radiological Exams: No results  found.  Assessment/Plan Active Problems:   Acute on chronic respiratory failure with hypoxia (HCC)   Rate controlled atrial fibrillation (HCC)   COPD, severe (HCC)   COVID-19 virus infection   Healthcare-associated pneumonia   1. Acute on chronic respiratory failure hypoxia we will continue to advance to capping today will be completing 24 hours. 2. Atrial fibrillation rate controlled right now we will continue to monitor 3. Severe COPD at baseline we will continue present management 4. COVID-19 virus infection in resolution phase 5. Healthcare associated pneumonia at baseline we will continue to monitor   I have personally seen and evaluated the patient, evaluated laboratory and imaging results, formulated the assessment and plan and placed orders. The Patient requires high complexity decision making with multiple systems involvement.  Rounds were done with the Respiratory Therapy Director and Staff therapists and discussed with nursing staff also.  Yevonne Pax, MD [facility] Pulmonary Critical Care Medicine Sleep Medicine

## 2020-07-06 DIAGNOSIS — J189 Pneumonia, unspecified organism: Secondary | ICD-10-CM | POA: Diagnosis not present

## 2020-07-06 DIAGNOSIS — J9621 Acute and chronic respiratory failure with hypoxia: Secondary | ICD-10-CM | POA: Diagnosis not present

## 2020-07-06 DIAGNOSIS — J449 Chronic obstructive pulmonary disease, unspecified: Secondary | ICD-10-CM | POA: Diagnosis not present

## 2020-07-06 DIAGNOSIS — U071 COVID-19: Secondary | ICD-10-CM | POA: Diagnosis not present

## 2020-07-06 NOTE — Progress Notes (Signed)
Pulmonary Critical Care Medicine [facility] [place]   PULMONARY CRITICAL CARE SERVICE  PROGRESS NOTE  Date of Service: 07/06/2020  Vernon Schultz  [id number]  DOB: [birth year]   DOA: 06/14/2020  Referring Physician: Carron Curie, MD  HPI: Vernon Schultz is a 64 y.o. male seen for follow up of Acute on Chronic Respiratory Failure.  Patient is capping right now on 2 L of oxygen for goal of 48 hours  Medications: Reviewed on Rounds  Physical Exam:  Vitals: Temperature is 98.0 pulse 77 respiratory rate 20 blood pressure is 176/85 saturations 100%  Ventilator Settings capping on 2 L O2  . General: Comfortable at this time . Eyes: Grossly normal lids, irises & conjunctiva . ENT: grossly tongue is normal . Neck: no obvious mass . Cardiovascular: S1 S2 normal no gallop . Respiratory: No rhonchi coarse breath sounds . Abdomen: soft . Skin: no rash seen on limited exam . Musculoskeletal: not rigid . Psychiatric:unable to assess . Neurologic: no seizure no involuntary movements         Lab Data:   Basic Metabolic Panel: Recent Labs  Lab 07/04/20 0530 07/05/20 1140  NA 136  --   K 5.7* 4.5  CL 98  --   CO2 26  --   GLUCOSE 118*  --   BUN 20  --   CREATININE 0.71  --   CALCIUM 9.0  --     ABG: No results for input(s): PHART, PCO2ART, PO2ART, HCO3, O2SAT in the last 168 hours.  Liver Function Tests: No results for input(s): AST, ALT, ALKPHOS, BILITOT, PROT, ALBUMIN in the last 168 hours. No results for input(s): LIPASE, AMYLASE in the last 168 hours. No results for input(s): AMMONIA in the last 168 hours.  CBC: Recent Labs  Lab 07/04/20 0530 07/05/20 1140  WBC 11.6* 7.8  HGB 10.1* 10.4*  HCT 33.3* 35.8*  MCV 86.0 87.1  PLT 432* 391    Cardiac Enzymes: No results for input(s): CKTOTAL, CKMB, CKMBINDEX, TROPONINI in the last 168 hours.  BNP (last 3 results) No results for input(s): BNP in the last 8760 hours.  ProBNP (last 3 results) No results  for input(s): PROBNP in the last 8760 hours.  Radiological Exams: No results found.  Assessment/Plan Active Problems:   Acute on chronic respiratory failure with hypoxia (HCC)   Rate controlled atrial fibrillation (HCC)   COPD, severe (HCC)   COVID-19 virus infection   Healthcare-associated pneumonia   1. Acute on chronic respiratory failure hypoxia plan is going to be to continue with capping over 48 hours 2. Rate control atrial fibrillation we will continue with present management 3. Severe COPD medical management continue present management 4. COVID-19 virus infection resolved continue supportive care 5. Healthcare associated pneumonia has been treated we will continue to follow   I have personally seen and evaluated the patient, evaluated laboratory and imaging results, formulated the assessment and plan and placed orders. The Patient requires high complexity decision making with multiple systems involvement.  Rounds were done with the Respiratory Therapy Director and Staff therapists and discussed with nursing staff also.  Yevonne Pax, MD [facility] Pulmonary Critical Care Medicine Sleep Medicine

## 2020-07-07 DIAGNOSIS — J9621 Acute and chronic respiratory failure with hypoxia: Secondary | ICD-10-CM | POA: Diagnosis not present

## 2020-07-07 DIAGNOSIS — J449 Chronic obstructive pulmonary disease, unspecified: Secondary | ICD-10-CM | POA: Diagnosis not present

## 2020-07-07 DIAGNOSIS — U071 COVID-19: Secondary | ICD-10-CM | POA: Diagnosis not present

## 2020-07-07 DIAGNOSIS — J189 Pneumonia, unspecified organism: Secondary | ICD-10-CM | POA: Diagnosis not present

## 2020-07-07 NOTE — Progress Notes (Signed)
Pulmonary Critical Care Medicine [facility] [place]   PULMONARY CRITICAL CARE SERVICE  PROGRESS NOTE  Date of Service: 07/07/2020  Vernon Schultz  [id number]  DOB: 06-12-1956   DOA: 06/14/2020  Referring Physician: Carron Curie, MD  HPI: Vernon Schultz is a 64 y.o. male seen for follow up of Acute on Chronic Respiratory Failure. Patient is capping right now has been doing about 72 hours of capping currently is on 3 L O2  Medications: Reviewed on Rounds  Physical Exam:  Vitals: Temperature 97.4 pulse 80 respiratory 25 blood pressure is 165/87 saturations 100%  Ventilator Settings capping on 3 L  . General: Comfortable at this time . Eyes: Grossly normal lids, irises & conjunctiva . ENT: grossly tongue is normal . Neck: no obvious mass . Cardiovascular: S1 S2 normal no gallop . Respiratory: No rhonchi coarse breath sounds . Abdomen: soft . Skin: no rash seen on limited exam . Musculoskeletal: not rigid . Psychiatric:unable to assess . Neurologic: no seizure no involuntary movements         Lab Data:   Basic Metabolic Panel: Recent Labs  Lab 07/04/20 0530 07/05/20 1140  NA 136  --   K 5.7* 4.5  CL 98  --   CO2 26  --   GLUCOSE 118*  --   BUN 20  --   CREATININE 0.71  --   CALCIUM 9.0  --     ABG: No results for input(s): PHART, PCO2ART, PO2ART, HCO3, O2SAT in the last 168 hours.  Liver Function Tests: No results for input(s): AST, ALT, ALKPHOS, BILITOT, PROT, ALBUMIN in the last 168 hours. No results for input(s): LIPASE, AMYLASE in the last 168 hours. No results for input(s): AMMONIA in the last 168 hours.  CBC: Recent Labs  Lab 07/04/20 0530 07/05/20 1140  WBC 11.6* 7.8  HGB 10.1* 10.4*  HCT 33.3* 35.8*  MCV 86.0 87.1  PLT 432* 391    Cardiac Enzymes: No results for input(s): CKTOTAL, CKMB, CKMBINDEX, TROPONINI in the last 168 hours.  BNP (last 3 results) No results for input(s): BNP in the last 8760 hours.  ProBNP (last 3  results) No results for input(s): PROBNP in the last 8760 hours.  Radiological Exams: No results found.  Assessment/Plan Active Problems:   Acute on chronic respiratory failure with hypoxia (HCC)   Rate controlled atrial fibrillation (HCC)   COPD, severe (HCC)   COVID-19 virus infection   Healthcare-associated pneumonia   1. Acute on chronic respiratory failure with hypoxia plan is to continue with capping trials 3 L oxygen will be continued. 2. Rate controlled atrial fibrillation note good control at this time we will continue to follow 3. Severe COPD medical management 4. COVID-19 virus infection in resolution phase 5. Healthcare associated pneumonia treated we will continue to follow along   I have personally seen and evaluated the patient, evaluated laboratory and imaging results, formulated the assessment and plan and placed orders. The Patient requires high complexity decision making with multiple systems involvement.  Rounds were done with the Respiratory Therapy Director and Staff therapists and discussed with nursing staff also.  Yevonne Pax, MD [facility] Pulmonary Critical Care Medicine Sleep Medicine

## 2020-07-08 DIAGNOSIS — U071 COVID-19: Secondary | ICD-10-CM | POA: Diagnosis not present

## 2020-07-08 DIAGNOSIS — J9621 Acute and chronic respiratory failure with hypoxia: Secondary | ICD-10-CM | POA: Diagnosis not present

## 2020-07-08 DIAGNOSIS — J189 Pneumonia, unspecified organism: Secondary | ICD-10-CM | POA: Diagnosis not present

## 2020-07-08 DIAGNOSIS — J449 Chronic obstructive pulmonary disease, unspecified: Secondary | ICD-10-CM | POA: Diagnosis not present

## 2020-07-08 LAB — BASIC METABOLIC PANEL
Anion gap: 11 (ref 5–15)
BUN: 15 mg/dL (ref 8–23)
CO2: 27 mmol/L (ref 22–32)
Calcium: 9.6 mg/dL (ref 8.9–10.3)
Chloride: 106 mmol/L (ref 98–111)
Creatinine, Ser: 0.57 mg/dL — ABNORMAL LOW (ref 0.61–1.24)
GFR calc Af Amer: >60 mL/min (ref >60)
GFR calc non Af Amer: >60 mL/min (ref >60)
Glucose, Bld: 118 mg/dL — ABNORMAL HIGH (ref 70–99)
Potassium: 4.3 mmol/L (ref 3.5–5.1)
Sodium: 144 mmol/L (ref 135–145)

## 2020-07-08 LAB — CBC
HCT: 36 % — ABNORMAL LOW (ref 39.0–52.0)
Hemoglobin: 10.3 g/dL — ABNORMAL LOW (ref 13.0–17.0)
MCH: 25.4 pg — ABNORMAL LOW (ref 26.0–34.0)
MCHC: 28.6 g/dL — ABNORMAL LOW (ref 30.0–36.0)
MCV: 88.9 fL (ref 80.0–100.0)
Platelets: 386 10*3/uL (ref 150–400)
RBC: 4.05 MIL/uL — ABNORMAL LOW (ref 4.22–5.81)
RDW: 20.6 % — ABNORMAL HIGH (ref 11.5–15.5)
WBC: 8.2 10*3/uL (ref 4.0–10.5)
nRBC: 0 % (ref 0.0–0.2)

## 2020-07-08 NOTE — Progress Notes (Addendum)
Pulmonary Critical Care Medicine [facility] [place]   PULMONARY CRITICAL CARE SERVICE  PROGRESS NOTE  Date of Service: 07/08/2020  Advait Buice  [id number]  DOB: 12/04/1956   DOA: 06/14/2020  Referring Physician: Carron Curie, MD  HPI: Zaniel Marineau is a 64 y.o. male seen for follow up of Acute on Chronic Respiratory Failure.  Patient remains capped at this time on 5 L nasal cannula satting well no distress  Medications: Reviewed on Rounds  Physical Exam:  Vitals: Pulse 84 respirations 27 BP 165/83 O2 sat 90% temp 98.0  Ventilator Settings not currently on ventilator 5 L nasal cannula  . General: Comfortable at this time . Eyes: Grossly normal lids, irises & conjunctiva . ENT: grossly tongue is normal . Neck: no obvious mass . Cardiovascular: S1 S2 normal no gallop . Respiratory: No rales or rhonchi noted . Abdomen: soft . Skin: no rash seen on limited exam . Musculoskeletal: not rigid . Psychiatric:unable to assess . Neurologic: no seizure no involuntary movements         Lab Data:   Basic Metabolic Panel: Recent Labs  Lab 07/04/20 0530 07/05/20 1140 07/08/20 0549  NA 136  --  144  K 5.7* 4.5 4.3  CL 98  --  106  CO2 26  --  27  GLUCOSE 118*  --  118*  BUN 20  --  15  CREATININE 0.71  --  0.57*  CALCIUM 9.0  --  9.6    ABG: No results for input(s): PHART, PCO2ART, PO2ART, HCO3, O2SAT in the last 168 hours.  Liver Function Tests: No results for input(s): AST, ALT, ALKPHOS, BILITOT, PROT, ALBUMIN in the last 168 hours. No results for input(s): LIPASE, AMYLASE in the last 168 hours. No results for input(s): AMMONIA in the last 168 hours.  CBC: Recent Labs  Lab 07/04/20 0530 07/05/20 1140 07/08/20 0549  WBC 11.6* 7.8 8.2  HGB 10.1* 10.4* 10.3*  HCT 33.3* 35.8* 36.0*  MCV 86.0 87.1 88.9  PLT 432* 391 386    Cardiac Enzymes: No results for input(s): CKTOTAL, CKMB, CKMBINDEX, TROPONINI in the last 168 hours.  BNP (last 3  results) No results for input(s): BNP in the last 8760 hours.  ProBNP (last 3 results) No results for input(s): PROBNP in the last 8760 hours.  Radiological Exams: No results found.  Assessment/Plan Active Problems:   Acute on chronic respiratory failure with hypoxia (HCC)   Rate controlled atrial fibrillation (HCC)   COPD, severe (HCC)   COVID-19 virus infection   Healthcare-associated pneumonia   1. Acute on chronic respiratory failure with hypoxia plan is to continue with capping trials 5 L oxygen will be continued. 2. Rate controlled atrial fibrillation note good control at this time we will continue to follow 3. Severe COPD medical management 4. COVID-19 virus infection in resolution phase 5. Healthcare associated pneumonia treated we will continue to follow along   I have personally seen and evaluated the patient, evaluated laboratory and imaging results, formulated the assessment and plan and placed orders. The Patient requires high complexity decision making with multiple systems involvement.  Rounds were done with the Respiratory Therapy Director and Staff therapists and discussed with nursing staff also.  Yevonne Pax, MD [facility] Pulmonary Critical Care Medicine Sleep Medicine

## 2020-07-09 DIAGNOSIS — J189 Pneumonia, unspecified organism: Secondary | ICD-10-CM | POA: Diagnosis not present

## 2020-07-09 DIAGNOSIS — J449 Chronic obstructive pulmonary disease, unspecified: Secondary | ICD-10-CM | POA: Diagnosis not present

## 2020-07-09 DIAGNOSIS — J9621 Acute and chronic respiratory failure with hypoxia: Secondary | ICD-10-CM | POA: Diagnosis not present

## 2020-07-09 DIAGNOSIS — U071 COVID-19: Secondary | ICD-10-CM | POA: Diagnosis not present

## 2020-07-09 NOTE — Progress Notes (Signed)
Pulmonary Critical Care Medicine [facility] [place]   PULMONARY CRITICAL CARE SERVICE  PROGRESS NOTE  Date of Service: 07/09/2020  Tawan Degroote  [id number]  DOB: Jun 03, 1956   DOA: 06/14/2020  Referring Physician: Carron Curie, MD  HPI: Vernon Schultz is a 64 y.o. male seen for follow up of Acute on Chronic Respiratory Failure.  Patient is doing fine with capping there are no secretions reported no suctioning requirements.  She ready for decannulation.  Patient has been capping since July 6  Medications: Reviewed on Rounds  Physical Exam:  Vitals: Temperature 97.8 pulse 73 respiratory rate 24 blood pressure is 153/81 saturations 100%  Ventilator Settings capping  . General: Comfortable at this time . Eyes: Grossly normal lids, irises & conjunctiva . ENT: grossly tongue is normal . Neck: no obvious mass . Cardiovascular: S1 S2 normal no gallop . Respiratory: No rhonchi no rales noted at this time . Abdomen: soft . Skin: no rash seen on limited exam . Musculoskeletal: not rigid . Psychiatric:unable to assess . Neurologic: no seizure no involuntary movements         Lab Data:   Basic Metabolic Panel: Recent Labs  Lab 07/04/20 0530 07/05/20 1140 07/08/20 0549  NA 136  --  144  K 5.7* 4.5 4.3  CL 98  --  106  CO2 26  --  27  GLUCOSE 118*  --  118*  BUN 20  --  15  CREATININE 0.71  --  0.57*  CALCIUM 9.0  --  9.6    ABG: No results for input(s): PHART, PCO2ART, PO2ART, HCO3, O2SAT in the last 168 hours.  Liver Function Tests: No results for input(s): AST, ALT, ALKPHOS, BILITOT, PROT, ALBUMIN in the last 168 hours. No results for input(s): LIPASE, AMYLASE in the last 168 hours. No results for input(s): AMMONIA in the last 168 hours.  CBC: Recent Labs  Lab 07/04/20 0530 07/05/20 1140 07/08/20 0549  WBC 11.6* 7.8 8.2  HGB 10.1* 10.4* 10.3*  HCT 33.3* 35.8* 36.0*  MCV 86.0 87.1 88.9  PLT 432* 391 386    Cardiac Enzymes: No results for  input(s): CKTOTAL, CKMB, CKMBINDEX, TROPONINI in the last 168 hours.  BNP (last 3 results) No results for input(s): BNP in the last 8760 hours.  ProBNP (last 3 results) No results for input(s): PROBNP in the last 8760 hours.  Radiological Exams: No results found.  Assessment/Plan Active Problems:   Acute on chronic respiratory failure with hypoxia (HCC)   Rate controlled atrial fibrillation (HCC)   COPD, severe (HCC)   COVID-19 virus infection   Healthcare-associated pneumonia   1. Acute on chronic respiratory failure with hypoxia we will proceed to decannulation 2. Rate controlled atrial fibrillation supportive care 3. Severe COPD at baseline 4. COVID-19 virus infection resolved 5. Healthcare associated pneumonia treated improved   I have personally seen and evaluated the patient, evaluated laboratory and imaging results, formulated the assessment and plan and placed orders. The Patient requires high complexity decision making with multiple systems involvement.  Rounds were done with the Respiratory Therapy Director and Staff therapists and discussed with nursing staff also.  Yevonne Pax, MD [facility] Pulmonary Critical Care Medicine Sleep Medicine

## 2020-07-10 DIAGNOSIS — J189 Pneumonia, unspecified organism: Secondary | ICD-10-CM | POA: Diagnosis not present

## 2020-07-10 DIAGNOSIS — U071 COVID-19: Secondary | ICD-10-CM | POA: Diagnosis not present

## 2020-07-10 DIAGNOSIS — J449 Chronic obstructive pulmonary disease, unspecified: Secondary | ICD-10-CM | POA: Diagnosis not present

## 2020-07-10 DIAGNOSIS — J9621 Acute and chronic respiratory failure with hypoxia: Secondary | ICD-10-CM | POA: Diagnosis not present

## 2020-07-10 NOTE — Progress Notes (Signed)
Pulmonary Critical Care Medicine [facility] [place]   PULMONARY CRITICAL CARE SERVICE  PROGRESS NOTE  Date of Service: 07/10/2020  Kyre Jeffries  [id number]  DOB: [birth year]   DOA: 06/14/2020  Referring Physician: Carron Curie, MD  HPI: Vikrant Pryce is a 64 y.o. male seen for follow up of Acute on Chronic Respiratory Failure.  Patient currently is decannulated on 2 L oxygen  Medications: Reviewed on Rounds  Physical Exam:  Vitals: Temperature 7.6 pulse 69 respiratory rate is 20 blood pressure is 108/87 saturations 100%  Ventilator Settings decannulated on 2 L oxygen  . General: Comfortable at this time . Eyes: Grossly normal lids, irises & conjunctiva . ENT: grossly tongue is normal . Neck: no obvious mass . Cardiovascular: S1 S2 normal no gallop . Respiratory: No rhonchi coarse breath sounds . Abdomen: soft . Skin: no rash seen on limited exam . Musculoskeletal: not rigid . Psychiatric:unable to assess . Neurologic: no seizure no involuntary movements         Lab Data:   Basic Metabolic Panel: Recent Labs  Lab 07/04/20 0530 07/05/20 1140 07/08/20 0549  NA 136  --  144  K 5.7* 4.5 4.3  CL 98  --  106  CO2 26  --  27  GLUCOSE 118*  --  118*  BUN 20  --  15  CREATININE 0.71  --  0.57*  CALCIUM 9.0  --  9.6    ABG: No results for input(s): PHART, PCO2ART, PO2ART, HCO3, O2SAT in the last 168 hours.  Liver Function Tests: No results for input(s): AST, ALT, ALKPHOS, BILITOT, PROT, ALBUMIN in the last 168 hours. No results for input(s): LIPASE, AMYLASE in the last 168 hours. No results for input(s): AMMONIA in the last 168 hours.  CBC: Recent Labs  Lab 07/04/20 0530 07/05/20 1140 07/08/20 0549  WBC 11.6* 7.8 8.2  HGB 10.1* 10.4* 10.3*  HCT 33.3* 35.8* 36.0*  MCV 86.0 87.1 88.9  PLT 432* 391 386    Cardiac Enzymes: No results for input(s): CKTOTAL, CKMB, CKMBINDEX, TROPONINI in the last 168 hours.  BNP (last 3 results) No results for  input(s): BNP in the last 8760 hours.  ProBNP (last 3 results) No results for input(s): PROBNP in the last 8760 hours.  Radiological Exams: No results found.  Assessment/Plan Active Problems:   Acute on chronic respiratory failure with hypoxia (HCC)   Rate controlled atrial fibrillation (HCC)   COPD, severe (HCC)   COVID-19 virus infection   Healthcare-associated pneumonia   1. Acute on chronic respiratory failure with hypoxia plan is to continue with oxygen therapy and supportive care. 2. Atrial fibrillation rate is controlled we will continue to follow 3. Severe COPD at baseline 4. COVID-19 virus infection in resolution phase 5. Healthcare associated pneumonia treated improving   I have personally seen and evaluated the patient, evaluated laboratory and imaging results, formulated the assessment and plan and placed orders. The Patient requires high complexity decision making with multiple systems involvement.  Rounds were done with the Respiratory Therapy Director and Staff therapists and discussed with nursing staff also.  Yevonne Pax, MD [facility] Pulmonary Critical Care Medicine Sleep Medicine

## 2020-07-11 DIAGNOSIS — U071 COVID-19: Secondary | ICD-10-CM | POA: Diagnosis not present

## 2020-07-11 DIAGNOSIS — J9621 Acute and chronic respiratory failure with hypoxia: Secondary | ICD-10-CM | POA: Diagnosis not present

## 2020-07-11 DIAGNOSIS — J449 Chronic obstructive pulmonary disease, unspecified: Secondary | ICD-10-CM | POA: Diagnosis not present

## 2020-07-11 DIAGNOSIS — J189 Pneumonia, unspecified organism: Secondary | ICD-10-CM | POA: Diagnosis not present

## 2020-07-11 NOTE — Progress Notes (Signed)
Pulmonary Critical Care Medicine [facility] [place]   PULMONARY CRITICAL CARE SERVICE  PROGRESS NOTE  Date of Service: 07/11/2020  Vernon Schultz  [id number]  DOB: [birth year]   DOA: 06/14/2020  Referring Physician: Carron Curie, MD  HPI: Vernon Schultz is a 64 y.o. male seen for follow up of Acute on Chronic Respiratory Failure.  Patient is decannulated doing fine and has been without any significant desaturations.  Medications: Reviewed on Rounds  Physical Exam:  Vitals: Temperature 97.5 pulse 62 respiratory 18 blood pressure is 166/92 saturations 100%  Ventilator Settings decannulated off the vent  . General: Comfortable at this time . Eyes: Grossly normal lids, irises & conjunctiva . ENT: grossly tongue is normal . Neck: no obvious mass . Cardiovascular: S1 S2 normal no gallop . Respiratory: No rhonchi no rales . Abdomen: soft . Skin: no rash seen on limited exam . Musculoskeletal: not rigid . Psychiatric:unable to assess . Neurologic: no seizure no involuntary movements         Lab Data:   Basic Metabolic Panel: Recent Labs  Lab 07/05/20 1140 07/08/20 0549  NA  --  144  K 4.5 4.3  CL  --  106  CO2  --  27  GLUCOSE  --  118*  BUN  --  15  CREATININE  --  0.57*  CALCIUM  --  9.6    ABG: No results for input(s): PHART, PCO2ART, PO2ART, HCO3, O2SAT in the last 168 hours.  Liver Function Tests: No results for input(s): AST, ALT, ALKPHOS, BILITOT, PROT, ALBUMIN in the last 168 hours. No results for input(s): LIPASE, AMYLASE in the last 168 hours. No results for input(s): AMMONIA in the last 168 hours.  CBC: Recent Labs  Lab 07/05/20 1140 07/08/20 0549  WBC 7.8 8.2  HGB 10.4* 10.3*  HCT 35.8* 36.0*  MCV 87.1 88.9  PLT 391 386    Cardiac Enzymes: No results for input(s): CKTOTAL, CKMB, CKMBINDEX, TROPONINI in the last 168 hours.  BNP (last 3 results) No results for input(s): BNP in the last 8760 hours.  ProBNP (last 3 results) No  results for input(s): PROBNP in the last 8760 hours.  Radiological Exams: No results found.  Assessment/Plan Active Problems:   Acute on chronic respiratory failure with hypoxia (HCC)   Rate controlled atrial fibrillation (HCC)   COPD, severe (HCC)   COVID-19 virus infection   Healthcare-associated pneumonia   1. Acute on chronic respiratory failure hypoxia plan is to continue with supportive care patient's secretions are minimal 2. Rate controlled atrial fibrillation we will continue to follow along 3. Severe COPD medical management 4. COVID-19 virus infection resolution phase 5. Healthcare associated pneumonia improved   I have personally seen and evaluated the patient, evaluated laboratory and imaging results, formulated the assessment and plan and placed orders. The Patient requires high complexity decision making with multiple systems involvement.  Rounds were done with the Respiratory Therapy Director and Staff therapists and discussed with nursing staff also.  Yevonne Pax, MD [facility] Pulmonary Critical Care Medicine Sleep Medicine

## 2020-07-12 DIAGNOSIS — J9621 Acute and chronic respiratory failure with hypoxia: Secondary | ICD-10-CM | POA: Diagnosis not present

## 2020-07-12 DIAGNOSIS — J449 Chronic obstructive pulmonary disease, unspecified: Secondary | ICD-10-CM | POA: Diagnosis not present

## 2020-07-12 DIAGNOSIS — U071 COVID-19: Secondary | ICD-10-CM | POA: Diagnosis not present

## 2020-07-12 DIAGNOSIS — J189 Pneumonia, unspecified organism: Secondary | ICD-10-CM | POA: Diagnosis not present

## 2020-07-12 LAB — CBC
HCT: 40.4 % (ref 39.0–52.0)
Hemoglobin: 11.5 g/dL — ABNORMAL LOW (ref 13.0–17.0)
MCH: 25.2 pg — ABNORMAL LOW (ref 26.0–34.0)
MCHC: 28.5 g/dL — ABNORMAL LOW (ref 30.0–36.0)
MCV: 88.4 fL (ref 80.0–100.0)
Platelets: 352 10*3/uL (ref 150–400)
RBC: 4.57 MIL/uL (ref 4.22–5.81)
RDW: 20.2 % — ABNORMAL HIGH (ref 11.5–15.5)
WBC: 6.3 10*3/uL (ref 4.0–10.5)
nRBC: 0 % (ref 0.0–0.2)

## 2020-07-12 LAB — BASIC METABOLIC PANEL
Anion gap: 8 (ref 5–15)
BUN: 13 mg/dL (ref 8–23)
CO2: 31 mmol/L (ref 22–32)
Calcium: 9.4 mg/dL (ref 8.9–10.3)
Chloride: 104 mmol/L (ref 98–111)
Creatinine, Ser: 0.55 mg/dL — ABNORMAL LOW (ref 0.61–1.24)
GFR calc Af Amer: >60 mL/min (ref >60)
GFR calc non Af Amer: >60 mL/min (ref >60)
Glucose, Bld: 120 mg/dL — ABNORMAL HIGH (ref 70–99)
Potassium: 4.2 mmol/L (ref 3.5–5.1)
Sodium: 143 mmol/L (ref 135–145)

## 2020-07-12 LAB — PROTIME-INR
INR: 1.2 (ref 0.8–1.2)
Prothrombin Time: 15 seconds (ref 11.4–15.2)

## 2020-07-12 LAB — NOVEL CORONAVIRUS, NAA (HOSP ORDER, SEND-OUT TO REF LAB; TAT 18-24 HRS): SARS-CoV-2, NAA: NOT DETECTED

## 2020-07-12 NOTE — Progress Notes (Addendum)
Pulmonary Critical Care Medicine [facility] [place]   PULMONARY CRITICAL CARE SERVICE  PROGRESS NOTE  Date of Service: 07/12/2020  Goetzinger Hamrick  [id number]  DOB: 18-Jul-1956   DOA: 06/14/2020  Referring Physician: Carron Curie, MD  HPI: Vernon Schultz is a 64 y.o. male seen for follow up of Acute on Chronic Respiratory Failure.  Patient remains decannulated on 1 L at this time satting well no distress  Medications: Reviewed on Rounds  Physical Exam:  Vitals: Pulse 69 respirations 24 BP 154/93 O2 sat 96% temp 96 point  Ventilator Settings not currently on ventilator  . General: Comfortable at this time . Eyes: Grossly normal lids, irises & conjunctiva . ENT: grossly tongue is normal . Neck: no obvious mass . Cardiovascular: S1 S2 normal no gallop . Respiratory: No rales or rhonchi noted . Abdomen: soft . Skin: no rash seen on limited exam . Musculoskeletal: not rigid . Psychiatric:unable to assess . Neurologic: no seizure no involuntary movements         Lab Data:   Basic Metabolic Panel: Recent Labs  Lab 07/08/20 0549 07/12/20 0658  NA 144 143  K 4.3 4.2  CL 106 104  CO2 27 31  GLUCOSE 118* 120*  BUN 15 13  CREATININE 0.57* 0.55*  CALCIUM 9.6 9.4    ABG: No results for input(s): PHART, PCO2ART, PO2ART, HCO3, O2SAT in the last 168 hours.  Liver Function Tests: No results for input(s): AST, ALT, ALKPHOS, BILITOT, PROT, ALBUMIN in the last 168 hours. No results for input(s): LIPASE, AMYLASE in the last 168 hours. No results for input(s): AMMONIA in the last 168 hours.  CBC: Recent Labs  Lab 07/08/20 0549 07/12/20 0658  WBC 8.2 6.3  HGB 10.3* 11.5*  HCT 36.0* 40.4  MCV 88.9 88.4  PLT 386 352    Cardiac Enzymes: No results for input(s): CKTOTAL, CKMB, CKMBINDEX, TROPONINI in the last 168 hours.  BNP (last 3 results) No results for input(s): BNP in the last 8760 hours.  ProBNP (last 3 results) No results for input(s): PROBNP in the  last 8760 hours.  Radiological Exams: No results found.  Assessment/Plan Active Problems:   Acute on chronic respiratory failure with hypoxia (HCC)   Rate controlled atrial fibrillation (HCC)   COPD, severe (HCC)   COVID-19 virus infection   Healthcare-associated pneumonia   1. Acute on chronic respiratory failure hypoxia plan is to continue with supportive care patient's secretions are minimal 2. Rate controlled atrial fibrillation we will continue to follow along 3. Severe COPD medical management 4. COVID-19 virus infection resolution phase 5. Healthcare associated pneumonia improved   I have personally seen and evaluated the patient, evaluated laboratory and imaging results, formulated the assessment and plan and placed orders. The Patient requires high complexity decision making with multiple systems involvement.  Rounds were done with the Respiratory Therapy Director and Staff therapists and discussed with nursing staff also.  Yevonne Pax, MD [facility] Pulmonary Critical Care Medicine Sleep Medicine

## 2020-07-13 DIAGNOSIS — J189 Pneumonia, unspecified organism: Secondary | ICD-10-CM | POA: Diagnosis not present

## 2020-07-13 DIAGNOSIS — U071 COVID-19: Secondary | ICD-10-CM | POA: Diagnosis not present

## 2020-07-13 DIAGNOSIS — J449 Chronic obstructive pulmonary disease, unspecified: Secondary | ICD-10-CM | POA: Diagnosis not present

## 2020-07-13 DIAGNOSIS — J9621 Acute and chronic respiratory failure with hypoxia: Secondary | ICD-10-CM | POA: Diagnosis not present

## 2020-07-13 LAB — PROTIME-INR
INR: 1.3 — ABNORMAL HIGH (ref 0.8–1.2)
Prothrombin Time: 15.6 seconds — ABNORMAL HIGH (ref 11.4–15.2)

## 2020-07-13 NOTE — Progress Notes (Addendum)
Pulmonary Critical Care Medicine [facility] [place]   PULMONARY CRITICAL CARE SERVICE  PROGRESS NOTE  Date of Service: 07/13/2020  Javel Hersh  [id number]  DOB: Mar 23, 1956   DOA: 06/14/2020  Referring Physician: Carron Curie, MD  HPI: Valmore Arabie is a 64 y.o. male seen for follow up of Acute on Chronic Respiratory Failure.  Patient is decannulated on 1 L oxygen good saturations are noted.  Secretions are minimal.  Medications: Reviewed on Rounds  Physical Exam:  Vitals: Temperature 97.5 pulse 61 respiratory rate 22 blood pressure is 152/85 saturations 100%  Ventilator Settings off the ventilator on 1 L oxygen  . General: Comfortable at this time . Eyes: Grossly normal lids, irises & conjunctiva . ENT: grossly tongue is normal . Neck: no obvious mass . Cardiovascular: S1 S2 normal no gallop . Respiratory: Coarse breath sounds with few scattered rhonchi . Abdomen: soft . Skin: no rash seen on limited exam . Musculoskeletal: not rigid . Psychiatric:unable to assess . Neurologic: no seizure no involuntary movements         Lab Data:   Basic Metabolic Panel: Recent Labs  Lab 07/08/20 0549 07/12/20 0658  NA 144 143  K 4.3 4.2  CL 106 104  CO2 27 31  GLUCOSE 118* 120*  BUN 15 13  CREATININE 0.57* 0.55*  CALCIUM 9.6 9.4    ABG: No results for input(s): PHART, PCO2ART, PO2ART, HCO3, O2SAT in the last 168 hours.  Liver Function Tests: No results for input(s): AST, ALT, ALKPHOS, BILITOT, PROT, ALBUMIN in the last 168 hours. No results for input(s): LIPASE, AMYLASE in the last 168 hours. No results for input(s): AMMONIA in the last 168 hours.  CBC: Recent Labs  Lab 07/08/20 0549 07/12/20 0658  WBC 8.2 6.3  HGB 10.3* 11.5*  HCT 36.0* 40.4  MCV 88.9 88.4  PLT 386 352    Cardiac Enzymes: No results for input(s): CKTOTAL, CKMB, CKMBINDEX, TROPONINI in the last 168 hours.  BNP (last 3 results) No results for input(s): BNP in the last 8760  hours.  ProBNP (last 3 results) No results for input(s): PROBNP in the last 8760 hours.  Radiological Exams: No results found.  Assessment/Plan Active Problems:   Acute on chronic respiratory failure with hypoxia (HCC)   Rate controlled atrial fibrillation (HCC)   COPD, severe (HCC)   COVID-19 virus infection   Healthcare-associated pneumonia   1. Acute on chronic respiratory failure hypoxia plan is to continue with oxygen therapy will continue to follow along.   2. Rate controlled atrial fibrillation continue with present medical management 3. Severe COPD at baseline 4. COVID-19 virus infection in resolution phase 5. Healthcare associated pneumonia treated continue to follow   I have personally seen and evaluated the patient, evaluated laboratory and imaging results, formulated the assessment and plan and placed orders. The Patient requires high complexity decision making with multiple systems involvement.  Rounds were done with the Respiratory Therapy Director and Staff therapists and discussed with nursing staff also.  Yevonne Pax, MD [facility] Pulmonary Critical Care Medicine Sleep Medicine Okay usual booking list ABG in are you okay with it okay will follow tell you we will from my on I have to confirm so prices no change on so it is going it is perfectly fine but you leave from [place] at 11 PM so that on Thursdays on Thursday "afternoon may necrosectomy fianc at.[email or web address]

## 2020-07-14 DIAGNOSIS — J9621 Acute and chronic respiratory failure with hypoxia: Secondary | ICD-10-CM | POA: Diagnosis not present

## 2020-07-14 DIAGNOSIS — J189 Pneumonia, unspecified organism: Secondary | ICD-10-CM | POA: Diagnosis not present

## 2020-07-14 DIAGNOSIS — J449 Chronic obstructive pulmonary disease, unspecified: Secondary | ICD-10-CM | POA: Diagnosis not present

## 2020-07-14 DIAGNOSIS — U071 COVID-19: Secondary | ICD-10-CM | POA: Diagnosis not present

## 2020-07-14 LAB — PROTIME-INR
INR: 1.3 — ABNORMAL HIGH (ref 0.8–1.2)
Prothrombin Time: 15.6 seconds — ABNORMAL HIGH (ref 11.4–15.2)

## 2020-07-14 NOTE — Progress Notes (Addendum)
Pulmonary Critical Care Medicine [facility] [place]   PULMONARY CRITICAL CARE SERVICE  PROGRESS NOTE  Date of Service: 07/14/2020  Vernon Schultz  EZM:[id number]  DOB: 29-Jun-1956   DOA: 06/14/2020  Referring Physician: Carron Curie, MD  HPI: Vernon Schultz is a 64 y.o. male seen for follow up of Acute on Chronic Respiratory Failure.  Patient remains decannulated on 1 L nasal cannula at this time satting 100  Medications: Reviewed on Rounds  Physical Exam:  Vitals: Pulse 64 respirations 21 BP 162/89 O2 sat 100% temp 97.8  Ventilator Settings 1 L nasal cannula  . General: Comfortable at this time . Eyes: Grossly normal lids, irises & conjunctiva . ENT: grossly tongue is normal . Neck: no obvious mass . Cardiovascular: S1 S2 normal no gallop . Respiratory: No rales or rhonchi noted . Abdomen: soft . Skin: no rash seen on limited exam . Musculoskeletal: not rigid . Psychiatric:unable to assess . Neurologic: no seizure no involuntary movements         Lab Data:   Basic Metabolic Panel: Recent Labs  Lab 07/08/20 0549 07/12/20 0658  NA 144 143  K 4.3 4.2  CL 106 104  CO2 27 31  GLUCOSE 118* 120*  BUN 15 13  CREATININE 0.57* 0.55*  CALCIUM 9.6 9.4    ABG: No results for input(s): PHART, PCO2ART, PO2ART, HCO3, O2SAT in the last 168 hours.  Liver Function Tests: No results for input(s): AST, ALT, ALKPHOS, BILITOT, PROT, ALBUMIN in the last 168 hours. No results for input(s): LIPASE, AMYLASE in the last 168 hours. No results for input(s): AMMONIA in the last 168 hours.  CBC: Recent Labs  Lab 07/08/20 0549 07/12/20 0658  WBC 8.2 6.3  HGB 10.3* 11.5*  HCT 36.0* 40.4  MCV 88.9 88.4  PLT 386 352    Cardiac Enzymes: No results for input(s): CKTOTAL, CKMB, CKMBINDEX, TROPONINI in the last 168 hours.  BNP (last 3 results) No results for input(s): BNP in the last 8760 hours.  ProBNP (last 3 results) No results for input(s): PROBNP in the last 8760  hours.  Radiological Exams: No results found.  Assessment/Plan Active Problems:   Acute on chronic respiratory failure with hypoxia (HCC)   Rate controlled atrial fibrillation (HCC)   COPD, severe (HCC)   COVID-19 virus infection   Healthcare-associated pneumonia   1. Acute on chronic respiratory failure hypoxia plan is to continue with oxygen therapy will continue to follow along.   2. Rate controlled atrial fibrillation continue with present medical management 3. Severe COPD at baseline 4. COVID-19 virus infection in resolution phase 5. Healthcare associated pneumonia treated continue to follow   I have personally seen and evaluated the patient, evaluated laboratory and imaging results, formulated the assessment and plan and placed orders. The Patient requires high complexity decision making with multiple systems involvement.  Rounds were done with the Respiratory Therapy Director and Staff therapists and discussed with nursing staff also.  Yevonne Pax, MD [facility] Pulmonary Critical Care Medicine Sleep Medicine

## 2021-04-22 IMAGING — DX DG CHEST 1V PORT
1 series · 1 of 1 positions shown · non-contrast
Comparison: None.

CLINICAL DATA: Respiratory failure

EXAM:
PORTABLE CHEST 1 VIEW

[chest ap]
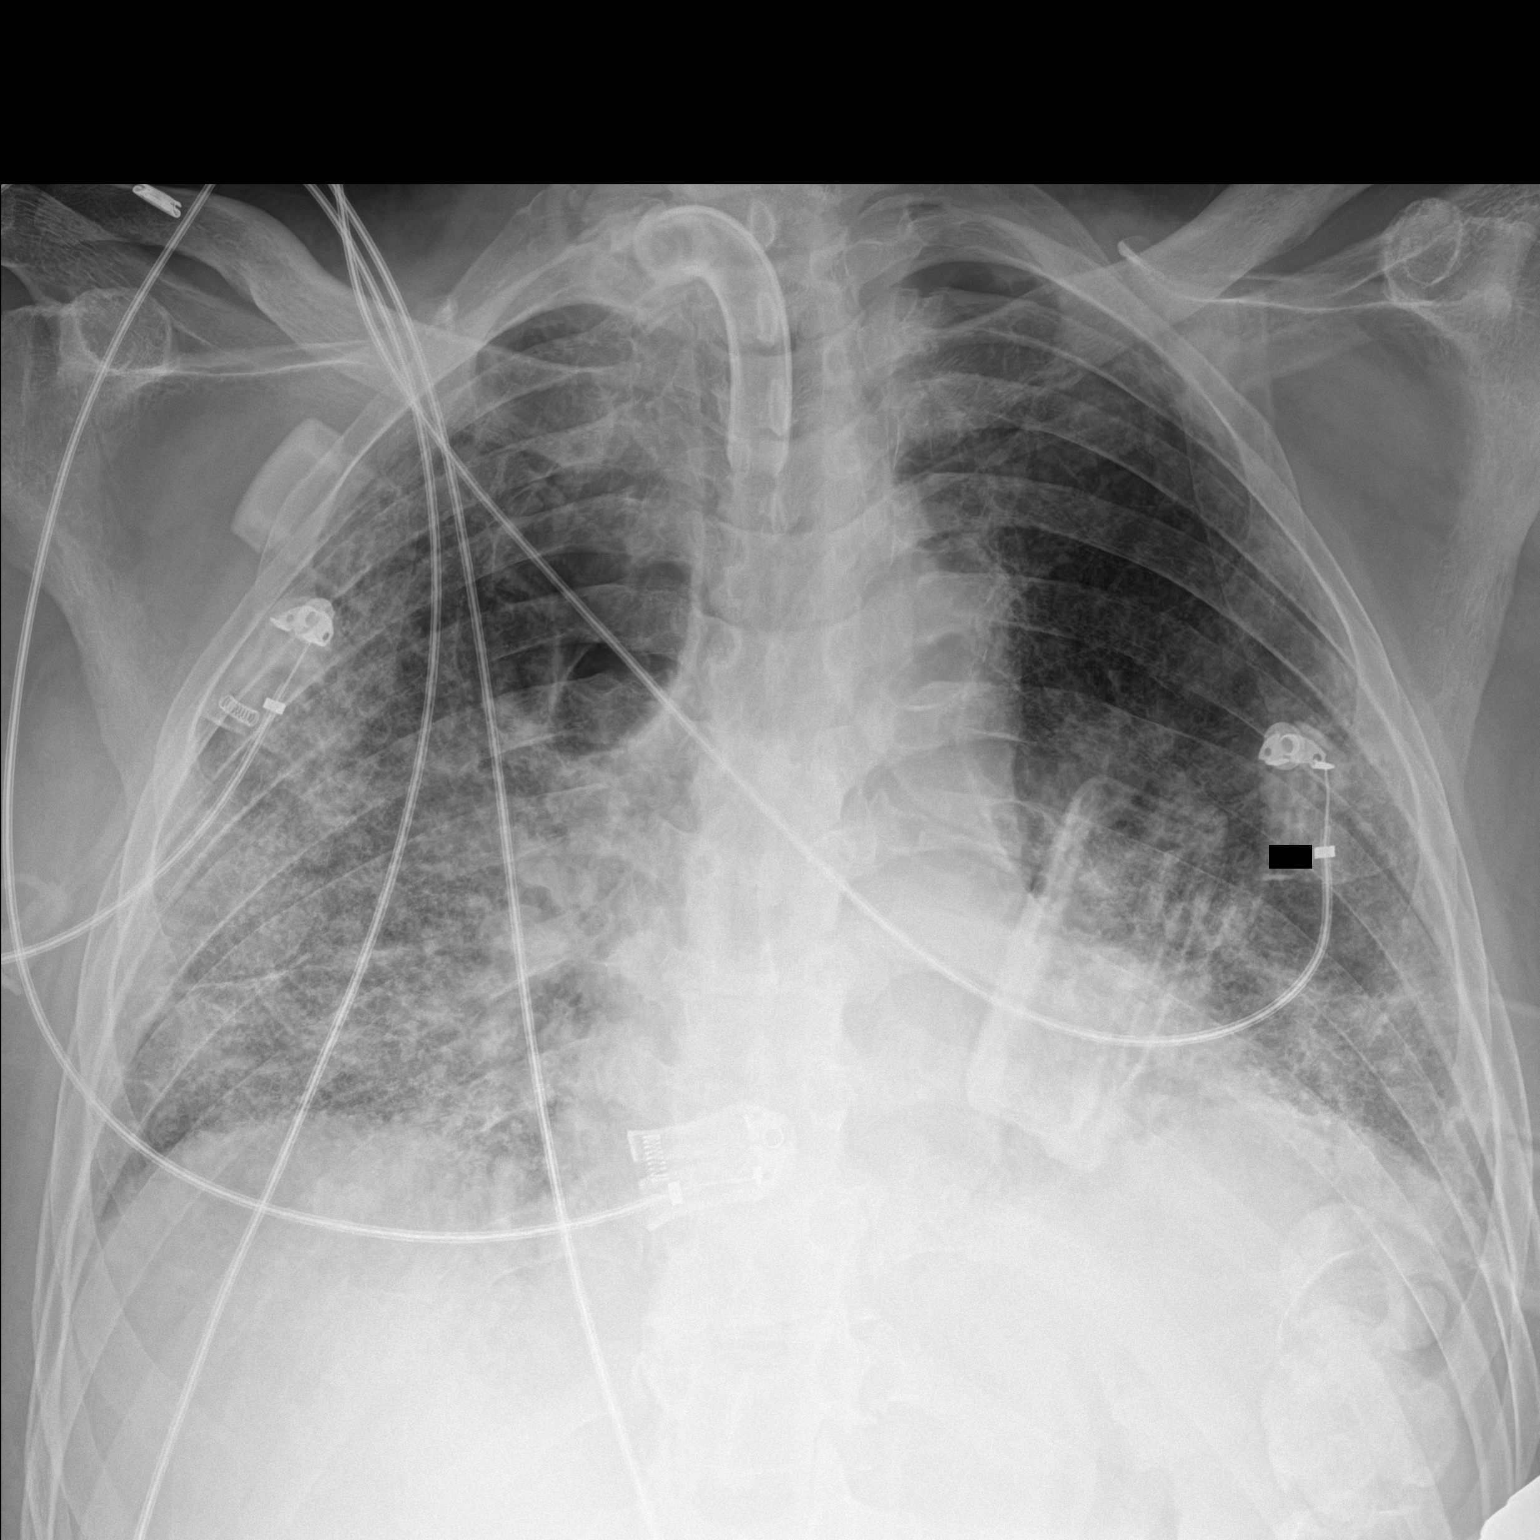

[1 of 1 positions shown; findings below may reference images not displayed]

FINDINGS: Tracheostomy tube is present. Heart size is within normal limits.
Patchy airspace opacities throughout the right lung and within the
left lung base. No appreciable pleural fluid collection. No
pneumothorax.
IMPRESSION: Patchy airspace opacities throughout the right lung and within the
left lung base. Findings are suspicious for multifocal pneumonia.

## 2021-04-26 IMAGING — DX DG CHEST 1V PORT
1 series · 1 of 1 positions shown · non-contrast
Comparison: One-view chest x-ray 6 06/19/2020

CLINICAL DATA: Respiratory failure.  COVID 19.

EXAM:
PORTABLE CHEST 1 VIEW

[chest ap]
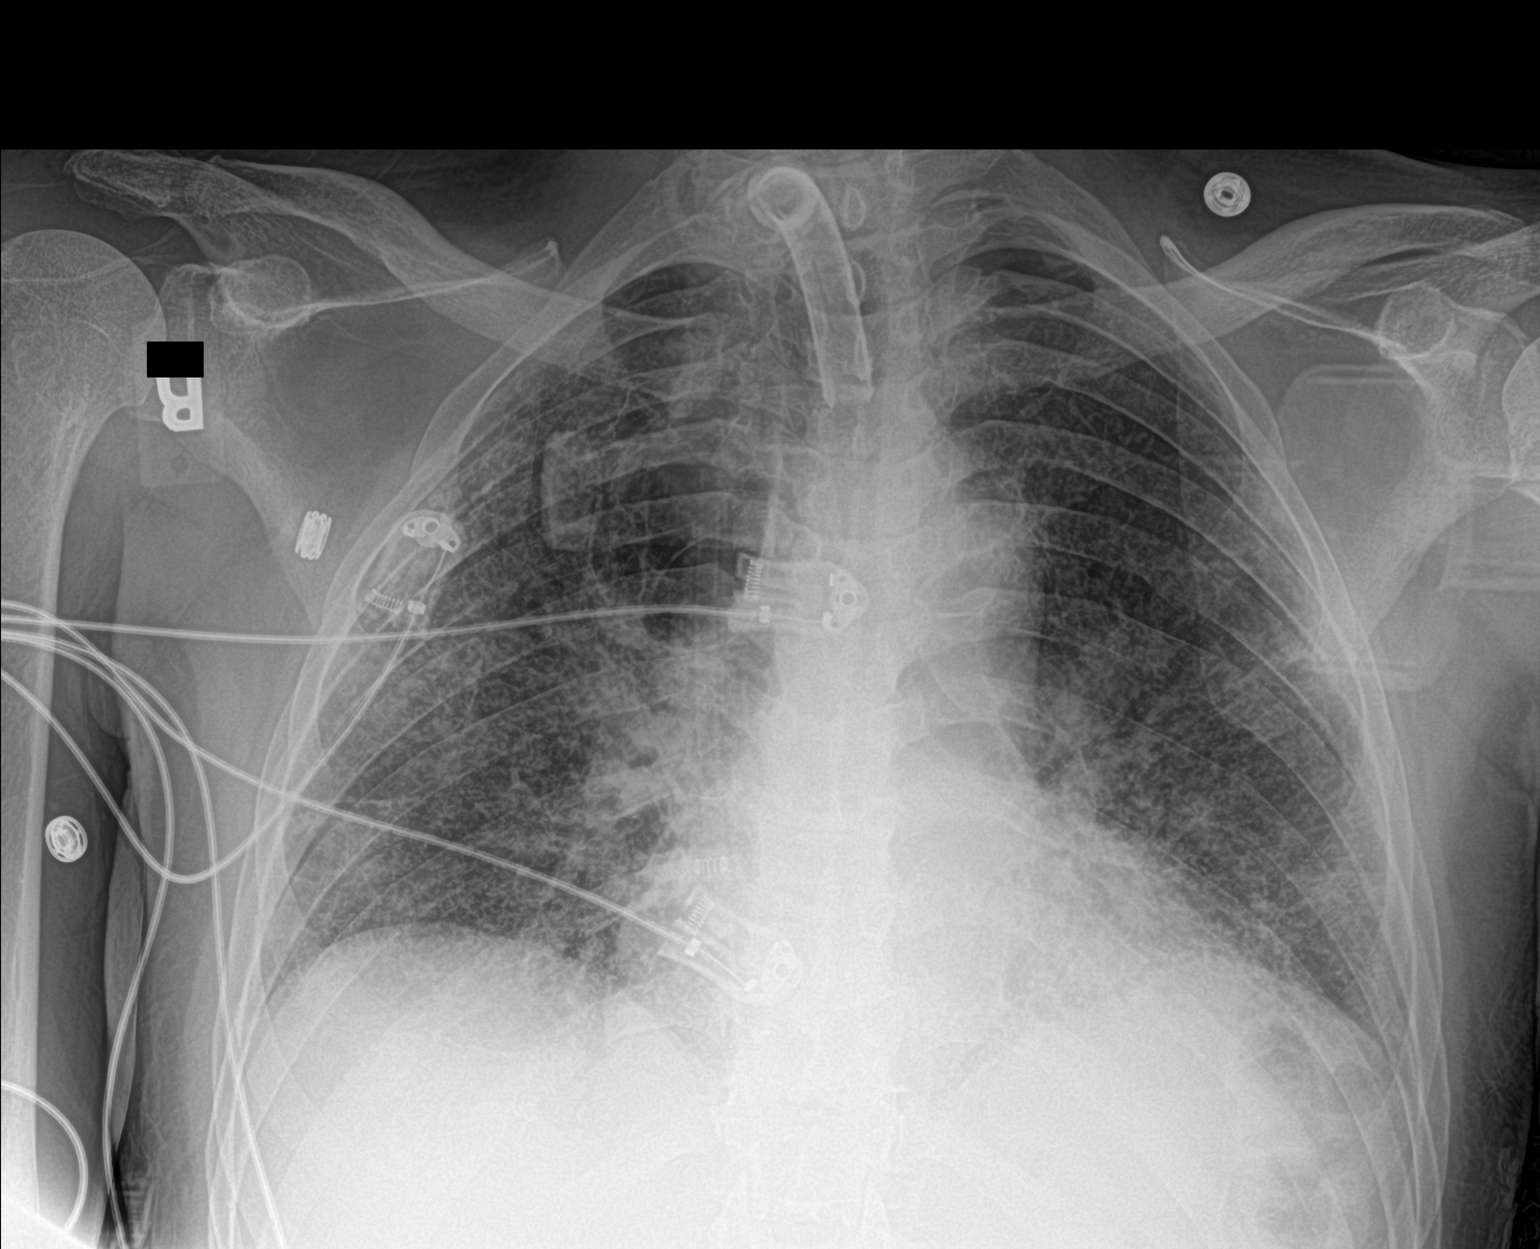

[1 of 1 positions shown; findings below may reference images not displayed]

FINDINGS: Heart size is normal. Lung volumes are low. Tracheostomy tube is
stable. Interstitial and airspace disease demonstrates slight
improvement from the prior exam.
IMPRESSION: Slight improvement in interstitial and airspace disease.
